# Patient Record
Sex: Female | Born: 1980 | Hispanic: No | Marital: Married | Smoking: Never smoker
Health system: Southern US, Community
[De-identification: ages and names within clinical notes are randomized; demographics above are authoritative.]

---

## 2000-09-08 ENCOUNTER — Emergency Department (HOSPITAL_COMMUNITY): Admission: EM | Admit: 2000-09-08 | Discharge: 2000-09-08 | Payer: Self-pay | Admitting: *Deleted

## 2000-09-09 ENCOUNTER — Encounter: Payer: Self-pay | Admitting: *Deleted

## 2000-09-09 ENCOUNTER — Ambulatory Visit (HOSPITAL_COMMUNITY): Admission: RE | Admit: 2000-09-09 | Discharge: 2000-09-09 | Payer: Self-pay | Admitting: *Deleted

## 2005-09-10 ENCOUNTER — Ambulatory Visit (HOSPITAL_COMMUNITY): Admission: AD | Admit: 2005-09-10 | Discharge: 2005-09-10 | Payer: Self-pay | Admitting: Obstetrics and Gynecology

## 2005-10-12 ENCOUNTER — Inpatient Hospital Stay (HOSPITAL_COMMUNITY): Admission: AD | Admit: 2005-10-12 | Discharge: 2005-10-16 | Payer: Self-pay | Admitting: Obstetrics and Gynecology

## 2005-10-13 ENCOUNTER — Encounter (INDEPENDENT_AMBULATORY_CARE_PROVIDER_SITE_OTHER): Payer: Self-pay | Admitting: Specialist

## 2006-12-05 ENCOUNTER — Emergency Department (HOSPITAL_COMMUNITY): Admission: EM | Admit: 2006-12-05 | Discharge: 2006-12-05 | Payer: Self-pay | Admitting: Emergency Medicine

## 2007-08-04 ENCOUNTER — Ambulatory Visit (HOSPITAL_COMMUNITY): Admission: RE | Admit: 2007-08-04 | Discharge: 2007-08-04 | Payer: Self-pay | Admitting: Family Medicine

## 2007-09-11 ENCOUNTER — Emergency Department (HOSPITAL_COMMUNITY): Admission: EM | Admit: 2007-09-11 | Discharge: 2007-09-11 | Payer: Self-pay | Admitting: Emergency Medicine

## 2009-08-18 ENCOUNTER — Emergency Department (HOSPITAL_COMMUNITY): Admission: EM | Admit: 2009-08-18 | Discharge: 2009-08-18 | Payer: Self-pay | Admitting: Emergency Medicine

## 2010-04-05 LAB — POCT PREGNANCY, URINE: Preg Test, Ur: NEGATIVE

## 2010-06-06 NOTE — H&P (Signed)
NAME:  Sherry Wilson, Sherry Wilson         ACCOUNT NO.:  [id number]   MEDICAL RECORD NO.:  [id number]          PATIENT TYPE:  INP   LOCATION:  LDR1                          FACILITY:  [facility]   PHYSICIAN:  Lazaro Arms, M.D.   DATE OF BIRTH:  1980-10-25   DATE OF ADMISSION:  10/12/2005  DATE OF DISCHARGE:  LH                                HISTORY & PHYSICAL   HISTORY OF PRESENT ILLNESS:  Sherry Wilson is a 30 year old white female gravida 1,  para 0, abortus 0, estimated date of delivery of October 17, 2005 by last  menstrual period and 8 week sonogram, currently at 39-1/[redacted] weeks gestation,  who is admitted for elective induction.  Cervix in the office was 1, 25, and  minus 1 station, vertex, soft.  Her mother has flown in from [place] two  weeks ago.  She has a total of four weeks here and they actually requested  induction last week which I thought was inappropriate but this week, close  to 40 weeks.  They understand and I talked with them extensively, that there  is an increased risk of having to have a Cesarean section due to this  elective induction and they understand that and accept that.  Of course,  they do want to trade that off in order to become delivered before the  patient's Mom has to go back.   PAST MEDICAL HISTORY:  Negative.   PAST SURGICAL HISTORY:  She had a benign skin growth on her head removed.   ALLERGIES:  PENICILLIN.   OBSTETRICAL HISTORY:  She is nulliparous.   MEDICATIONS:  Prenatal vitamins.   LABORATORY DATA:  Blood type A positive.  Rubella immune.  Antibody screen  is negative.  Hepatitis B is negative.  HIV is nonreactive.  HSV was  negative x2.  RPR was nonreactive x2.  Pap smear was normal.  Gonorrhea and  Chlamydia were negative x2.  She declined AFP.  Group B Strep is positive.  Glucola is 117.   IMPRESSION:  1. Intrauterine pregnancy at 39-1/[redacted] weeks gestation.  2. Elective induction of labor due to family reasons.   PLAN:  Patient is admitted for  Foley bulb ripening and Pitocin induction of  labor.  She understands the increased risk of Cesarean section due to  elective induction and is willing to accept that risk for family reasons.     Lazaro Arms, M.D.  Electronically Signed    LHE/MEDQ  D:  10/13/2005  T:  10/13/2005  Job:  [id number]

## 2010-06-06 NOTE — Discharge Summary (Signed)
Sherry Wilson, NOFSINGER            ACCOUNT NO.:  [id number]   MEDICAL RECORD NO.:  [id number]          PATIENT TYPE:  INP   LOCATION:  A413                          FACILITY:  [facility]   PHYSICIAN:  Tilda Burrow, M.D. DATE OF BIRTH:  28-Dec-1980   DATE OF ADMISSION:  10/12/2005  DATE OF DISCHARGE:  09/28/2007LH                                 DISCHARGE SUMMARY   ADMITTING DIAGNOSES:  1. Pregnancy at 39.[redacted] weeks gestation.  2. Elective induction at the patient's request.   DISCHARGE DIAGNOSES:  1. Pregnancy at 39.[redacted] weeks gestation.  2. Elective induction at the patient's request.  3. Failed medical induction.  4. Cephalopelvic disproportion.  5. Transient tachypnea of the newborn necessitating observation times 24      hours.   OPERATION/PROCEDURE:  1. October 12, 2005 _foley Bulb_  cervical ripening.  2. October 13, 2005 Pitocin induction of labor.  3. October 13, 2005 primary low-transverse cervical cesarean section      with delivery of a healthy female infant.  4. October 16, 2005 Gomco circumcision performed on the infant.   DISCHARGE MEDICATIONS:  Tylox 1-2 every four hours as needed for pain.   SUMMARY:  This is a 31 year old female gravida 1, para 0 who requests  elective induction of labor for family reasons as her mother flew in from  [place] two weeks ago; and, induction is scheduled to meet family needs.  In  terms of the infant, it is in the vertex presentation with the cervix is  considered reasonable for induction.   HOSPITAL COURSE:  The patient had full abrupt cervical ripening followed by  Pitocin induction.  Jaena did not have an epidural.  She progressed  through labor reaching complete dilation, and pushed excellently for 1-1.5  hours,  but had second stage arrest of labor.  She ended up going for a  primary cesarean section on the evening of October 13, 2005, with an  estimated blood loss of 600-800 mL.   Postoperative hemoglobin was 9.9 and  hematocrit was 28.2 compared to an  admitting hemoglobin of 11.9 and a hematocrit 34.3.  Maternal blood type is  A positive.  She did excellently with good supportive care of the infant.  The blood gas on the infant showed a pH of 7.280, pCO2 53 with TCO2 of 22.  The patient had an uneventful surgical course.   The baby did well for 24 hours and then required some extra oxygen for 24  hours due to tachypnea felt to be related to transient tachypnea in a  newborn only.  The mother was kept for a third day as a precaution; and, if  the baby is not able to be discharged this P.M. he will go to [facility]  status.   FOLLOW UP:  The patient will follow up in four days for staple removal.      Tilda Burrow, M.D.  Electronically Signed     JVF/MEDQ  D:  10/16/2005  T:  10/18/2005  Job:  [id number]   cc:   Jeoffrey Massed, MD  Fax: [phone]

## 2010-06-06 NOTE — Op Note (Signed)
Sherry Wilson, Sherry Wilson         ACCOUNT NO.:  [id number]   MEDICAL RECORD NO.:  [id number]          PATIENT TYPE:  INP   LOCATION:  A413                          FACILITY:  [facility]   PHYSICIAN:  Tilda Burrow, M.D. DATE OF BIRTH:  01/22/1980   DATE OF PROCEDURE:  DATE OF DISCHARGE:                                 OPERATIVE REPORT   PREOPERATIVE DIAGNOSES:  1. Pregnancy, 39 weeks, elective induction.  2. Cephalopelvic disproportion.   POSTOPERATIVE DIAGNOSES:  1. Pregnancy, 39 weeks, elective induction.  2. Cephalopelvic disproportion.   PROCEDURE:  Primary low lower segment transverse cesarean section.   SURGEON:  Tilda Burrow, M.D.   ASSISTANTAmie Critchley, CST.   ANESTHESIA:  Spinal, Glynn Octave.   COMPLICATIONS:  None.   FINDINGS:  A 9 pound 7 ounce female infant. Significant molding of the vertex  in occiput transverse position. The vertex was not even difficult to get out  of the pelvic inlet despite 1-1/2 hours of pushing.   DETAILS OF PROCEDURE:  The patient was taken to the operating room and  spinal anesthesia introduced, confirmed as effective with Foley catheter in  place.  The Pfannenstiel type incision was performed in standard fashion.  The vertex had not really entered the pelvis very well. Lower uterine  segment transverse incision was made without difficulty, extended laterally  using index finger traction and the fetal vertex rotated from its very  molded position into the vertex and the infant delivered easily.  Apgars 9  and 9 were assigned. Dr. Milinda Cave took care of the baby. Please see his notes  for further details.  The cord blood samples were obtained and the placenta  delivered intact, Schultze presentation. The uterus was irrigated with  saline solution and closed in two-layer running locking closure the first  layer, continuous running, the second layer.  The bladder flap was  approximated with interrupted 2-0 chromic x2.  The abdomen was  irrigated and  peritoneum closed. Sponge and needle counts correct.  Muscles were  loosely reapproximated with 2-0 chromic and then the procedure completed by  closure of the fascia with continuous running 0 Vicryl, interrupted 2-0  plain closure of the subcu fatty space and staple closure of the skin.  The  patient tolerated the procedure well and went to recovery room in good  condition.  Sponge and needle counts correct.      Tilda Burrow, M.D.  Electronically Signed     JVF/MEDQ  D:  10/13/2005  T:  10/15/2005  Job:  [id number]   cc:   Jeoffrey Massed, MD  Fax: [phone]

## 2010-06-06 NOTE — Group Therapy Note (Signed)
Sherry Wilson, Sherry Wilson            ACCOUNT NO.:  [id number]   MEDICAL RECORD NO.:  [id number]          PATIENT TYPE:  OIB   LOCATION:  A415                          FACILITY:  [facility]   PHYSICIAN:  Tilda Burrow, M.D. DATE OF BIRTH:  25-Dec-1980   DATE OF PROCEDURE:  DATE OF DISCHARGE:  09/10/2005                                   PROGRESS NOTE   HISTORY OF PRESENT ILLNESS:  Sherry Wilson is about 34-1/2 weeks' gestation, came  in with complaints of extremity swelling and vaginal pain.  She states that  she is just very sore and feels like her bones are breaking apart are when  she sits down or stands up or moves.  She is not having contractions.  Her  cervix was closed.  Fetal heart rate was reactive.  She does have mild  edema.  Her blood pressure was in the 100s over 70s.  UA is negative.   IMPRESSION:  Pains of pregnancy, reassuring maternal/fetal status.  Sherry Wilson  was discharged home with palliative measures for pains of pregnancy  discussed.      Jacklyn Shell, C.N.M.      Tilda Burrow, M.D.  Electronically Signed    FC/MEDQ  D:  09/10/2005  T:  09/11/2005  Job:  [id number]

## 2010-10-28 LAB — URINALYSIS, ROUTINE W REFLEX MICROSCOPIC
Leukocytes, UA: NEGATIVE
Nitrite: NEGATIVE
Specific Gravity, Urine: >1.03 — ABNORMAL HIGH
pH: 5

## 2010-10-28 LAB — URINE MICROSCOPIC-ADD ON

## 2010-10-28 LAB — PREGNANCY, URINE: Preg Test, Ur: NEGATIVE

## 2011-08-09 ENCOUNTER — Encounter (HOSPITAL_COMMUNITY): Payer: Self-pay | Admitting: Emergency Medicine

## 2011-08-09 ENCOUNTER — Emergency Department (HOSPITAL_COMMUNITY): Payer: BC Managed Care – PPO

## 2011-08-09 ENCOUNTER — Emergency Department (HOSPITAL_COMMUNITY)
Admission: EM | Admit: 2011-08-09 | Discharge: 2011-08-10 | Disposition: A | Discharge status: Home / Self Care | Payer: BC Managed Care – PPO | Attending: Emergency Medicine | Admitting: Emergency Medicine

## 2011-08-09 DIAGNOSIS — R110 Nausea: Secondary | ICD-10-CM | POA: Insufficient documentation

## 2011-08-09 DIAGNOSIS — M79609 Pain in unspecified limb: Secondary | ICD-10-CM | POA: Insufficient documentation

## 2011-08-09 DIAGNOSIS — S8011XA Contusion of right lower leg, initial encounter: Secondary | ICD-10-CM

## 2011-08-09 DIAGNOSIS — R42 Dizziness and giddiness: Secondary | ICD-10-CM | POA: Insufficient documentation

## 2011-08-09 LAB — URINALYSIS, ROUTINE W REFLEX MICROSCOPIC
Ketones, ur: NEGATIVE mg/dL
Protein, ur: NEGATIVE mg/dL
Urobilinogen, UA: 0.2 mg/dL (ref 0.0–1.0)

## 2011-08-09 LAB — CBC WITH DIFFERENTIAL/PLATELET
Basophils Absolute: 0 10*3/uL (ref 0.0–0.1)
Eosinophils Absolute: 0.1 10*3/uL (ref 0.0–0.7)
Eosinophils Relative: 2 % (ref 0–5)
MCH: 30.7 pg (ref 26.0–34.0)
MCV: 88 fL (ref 78.0–100.0)
Platelets: 225 10*3/uL (ref 150–400)
RDW: 12.5 % (ref 11.5–15.5)

## 2011-08-09 LAB — URINE MICROSCOPIC-ADD ON

## 2011-08-09 LAB — BASIC METABOLIC PANEL
Calcium: 8.9 mg/dL (ref 8.4–10.5)
Creatinine, Ser: 0.7 mg/dL (ref 0.50–1.10)
GFR calc non Af Amer: >90 mL/min (ref >90)
Glucose, Bld: 100 mg/dL — ABNORMAL HIGH (ref 70–99)
Sodium: 139 mEq/L (ref 135–145)

## 2011-08-09 MED ORDER — ONDANSETRON HCL 4 MG PO TABS: 8.0000 mg | ORAL_TABLET | Freq: Three times a day (TID) | ORAL | Status: AC | PRN

## 2011-08-09 MED ORDER — SODIUM CHLORIDE 0.9 % IV BOLUS (SEPSIS)
1000.0000 mL | Freq: Once | INTRAVENOUS | Status: AC
  Administered 2011-08-09: 1000 mL via INTRAVENOUS

## 2011-08-09 MED ORDER — ONDANSETRON 8 MG PO TBDP: 8.0000 mg | ORAL_TABLET | Freq: Once | ORAL | Status: DC

## 2011-08-09 MED ORDER — KETOROLAC TROMETHAMINE 30 MG/ML IJ SOLN
30.0000 mg | Freq: Once | INTRAMUSCULAR | Status: AC
  Administered 2011-08-09: 30 mg via INTRAVENOUS
  Filled 2011-08-09: qty 1

## 2011-08-09 MED ORDER — IBUPROFEN 600 MG PO TABS: 600.0000 mg | ORAL_TABLET | Freq: Four times a day (QID) | ORAL | Status: AC | PRN

## 2011-08-09 MED ORDER — ONDANSETRON HCL 4 MG/2ML IJ SOLN
4.0000 mg | Freq: Once | INTRAMUSCULAR | Status: AC
  Administered 2011-08-09: 4 mg via INTRAVENOUS

## 2011-08-09 MED ORDER — ONDANSETRON HCL 4 MG/2ML IJ SOLN
INTRAMUSCULAR | Status: AC
  Filled 2011-08-09: qty 2

## 2011-08-09 NOTE — ED Provider Notes (Signed)
History     CSN: [id number]  Arrival date & time 08/09/11  1308   First MD Initiated Contact with Patient 08/09/11 [phone]      Chief Complaint  Patient presents with  . Leg Pain  . Dizziness  . Nausea    (Consider location/radiation/quality/duration/timing/severity/associated sxs/prior treatment) HPI Comments: Sherry Wilson presents with persistent severe pain in her right lower anterior leg after hitting it on the steel bar of a go cart one week age.  The area was very swollen and bruised initially and she used ice throughout the week which has improved the swelling but not the pain. Her pain is constant and sharp without radiation.  It is worse with weight bearing but is able to bear weight.   She has also used tylenol without relief of symptoms.  She also reports nausea and feelings of lightheadedness for the past week which she partially believes is related to her lower extremity pain,  But also states she has not been drinking enough and believes she may be dehydrated.  Her LMP was 7 days ago and light - she and her husband are trying to get pregnant currently.  She reports transient increased lightheadedness when she stands too quickly,  Which resolves when she sits.  She denies palpitations,  Shortness of breath and chest pain.   The history is provided by the patient.    History reviewed. No pertinent past medical history.  Past Surgical History  Procedure Date  . Cesarean section     No family history on file.  History  Substance Use Topics  . Smoking status: Never Smoker   . Smokeless tobacco: Not on file  . Alcohol Use: No    OB History    Grav Para Term Preterm Abortions TAB SAB Ect Mult Living                  Review of Systems  Constitutional: Negative for fever.  HENT: Negative for congestion, sore throat and neck pain.   Eyes: Negative.   Respiratory: Negative for chest tightness and shortness of breath.   Cardiovascular: Negative for chest pain and  palpitations.  Gastrointestinal: Positive for nausea. Negative for vomiting, abdominal pain, diarrhea and constipation.  Genitourinary: Negative.   Musculoskeletal: Positive for arthralgias. Negative for joint swelling.  Skin: Negative.  Negative for rash and wound.  Neurological: Positive for light-headedness. Negative for weakness, numbness and headaches.  Hematological: Negative.   Psychiatric/Behavioral: Negative.     Allergies  Penicillins and Shellfish allergy  Home Medications  No current outpatient prescriptions on file.  BP 98/65  Pulse 90  Temp 98.7 F (37.1 C) (Oral)  Resp 16  Ht 5\' 3"  (1.6 m)  Wt 215 lb (97.523 kg)  BMI 38.09 kg/m2  SpO2 95%  LMP 08/03/2011  Physical Exam  Eyes: Conjunctivae are normal.  Cardiovascular:  Pulses:      Dorsalis pedis pulses are 2+ on the right side, and 2+ on the left side.       Posterior tibial pulses are 2+ on the right side, and 2+ on the left side.  Skin: Skin is warm. No rash noted. No erythema.       Old,  Near resolved ecchymosis right lower anterior tibia,  No palpable residual hematoma.  TTP along tibia,  No calf ttp or edema.     ED Course  Procedures (including critical care time)   Labs Reviewed  CBC WITH DIFFERENTIAL  BASIC METABOLIC PANEL  URINALYSIS,  ROUTINE W REFLEX MICROSCOPIC   No results found. Results for orders placed during the hospital encounter of 08/09/11  CBC WITH DIFFERENTIAL      Component Value Range   WBC 6.9  4.0 - 10.5 K/uL   RBC 4.59  3.87 - 5.11 MIL/uL   Hemoglobin 14.1  12.0 - 15.0 g/dL   HCT 16.1  09.6 - 04.5 %   MCV 88.0  78.0 - 100.0 fL   MCH 30.7  26.0 - 34.0 pg   MCHC 34.9  30.0 - 36.0 g/dL   RDW 40.9  81.1 - 91.4 %   Platelets 225  150 - 400 K/uL   Neutrophils Relative 72  43 - 77 %   Neutro Abs 5.0  1.7 - 7.7 K/uL   Lymphocytes Relative 21  12 - 46 %   Lymphs Abs 1.5  0.7 - 4.0 K/uL   Monocytes Relative 5  3 - 12 %   Monocytes Absolute 0.3  0.1 - 1.0 K/uL    Eosinophils Relative 2  0 - 5 %   Eosinophils Absolute 0.1  0.0 - 0.7 K/uL   Basophils Relative 0  0 - 1 %   Basophils Absolute 0.0  0.0 - 0.1 K/uL  BASIC METABOLIC PANEL      Component Value Range   Sodium 139  135 - 145 mEq/L   Potassium 3.8  3.5 - 5.1 mEq/L   Chloride 103  96 - 112 mEq/L   CO2 27  19 - 32 mEq/L   Glucose, Bld 100 (*) 70 - 99 mg/dL   BUN 10  6 - 23 mg/dL   Creatinine, Ser 7.82  0.50 - 1.10 mg/dL   Calcium 8.9  8.4 - 95.6 mg/dL   GFR calc non Af Amer >90  >90 mL/min   GFR calc Af Amer >90  >90 mL/min  URINALYSIS, ROUTINE W REFLEX MICROSCOPIC      Component Value Range   Color, Urine YELLOW  YELLOW   APPearance CLEAR  CLEAR   Specific Gravity, Urine 1.020  1.005 - 1.030   pH 6.5  5.0 - 8.0   Glucose, UA NEGATIVE  NEGATIVE mg/dL   Hgb urine dipstick SMALL (*) NEGATIVE   Bilirubin Urine NEGATIVE  NEGATIVE   Ketones, ur NEGATIVE  NEGATIVE mg/dL   Protein, ur NEGATIVE  NEGATIVE mg/dL   Urobilinogen, UA 0.2  0.0 - 1.0 mg/dL   Nitrite NEGATIVE  NEGATIVE   Leukocytes, UA NEGATIVE  NEGATIVE  URINE MICROSCOPIC-ADD ON      Component Value Range   Squamous Epithelial / LPF FEW (*) RARE   WBC, UA 0-2  <3 WBC/hpf   Bacteria, UA FEW (*) RARE   Urine bedside preg - negative.  No diagnosis found.  12:46 PM Pt with orthostatic VS.  Given 1 L NS.  Still with mild lightheadedness when stands.  Repeat of NS 1 L bolus x 1. Pt is also taking po fluids.  Pt did have a near syncopal event followed by emesis when IV was being started.  States she often becomes lightheaded around needles.  MDM  Labs and xrays reviewed prior to dc home.  Pt felt better at time of dc.  Prescribed ibuprofen for pain relief,  zofran prn nausea.  Pt with no cp, no  abd pain,  u preg negative - no emergent reason for nausea found on todays exam.  Pt encouraged heat applied to tibia area.  Recheck by pcp if sx not improving  over the next several days or for any worsened sx.        Burgess Amor,  [place] 08/10/11 [phone]

## 2011-08-09 NOTE — ED Notes (Signed)
Pt became unresponsive and pale just after IV was initiated. EMD, PA to bedside. Pt was only unresponsive for ~ 10 sec. Pt then began vomiting. Pt was very anxious about starting IV and stated she sometimes gets queasy. Pt was tearful earlier when having blood drawn. NAD at this time. Pt is responding appropriately and skin color is pink, WNL.

## 2011-08-09 NOTE — ED Notes (Signed)
Pt c/o rle pain after hitting leg on a steel bar x 1 week ago. Pt also c/o intermittent nausea and dizziness x 1 week.

## 2011-08-09 NOTE — ED Notes (Signed)
NAD. Pt resting quietly, awake and alert. Ice chips offered to pt.

## 2011-08-10 NOTE — ED Provider Notes (Signed)
Medical screening examination/treatment/procedure(s) were performed by non-physician practitioner and as supervising physician I was immediately available for consultation/collaboration.   Laray Anger, DO 08/10/11 1722

## 2013-07-17 IMAGING — CR DG TIBIA/FIBULA 2V*R*
2 series · 2 of 2 positions shown · non-contrast
Comparison: None.

CLINICAL DATA: Pain to mid/distal tibia

RIGHT TIBIA AND FIBULA - 2 VIEW

[view not recorded (1 of 2)]
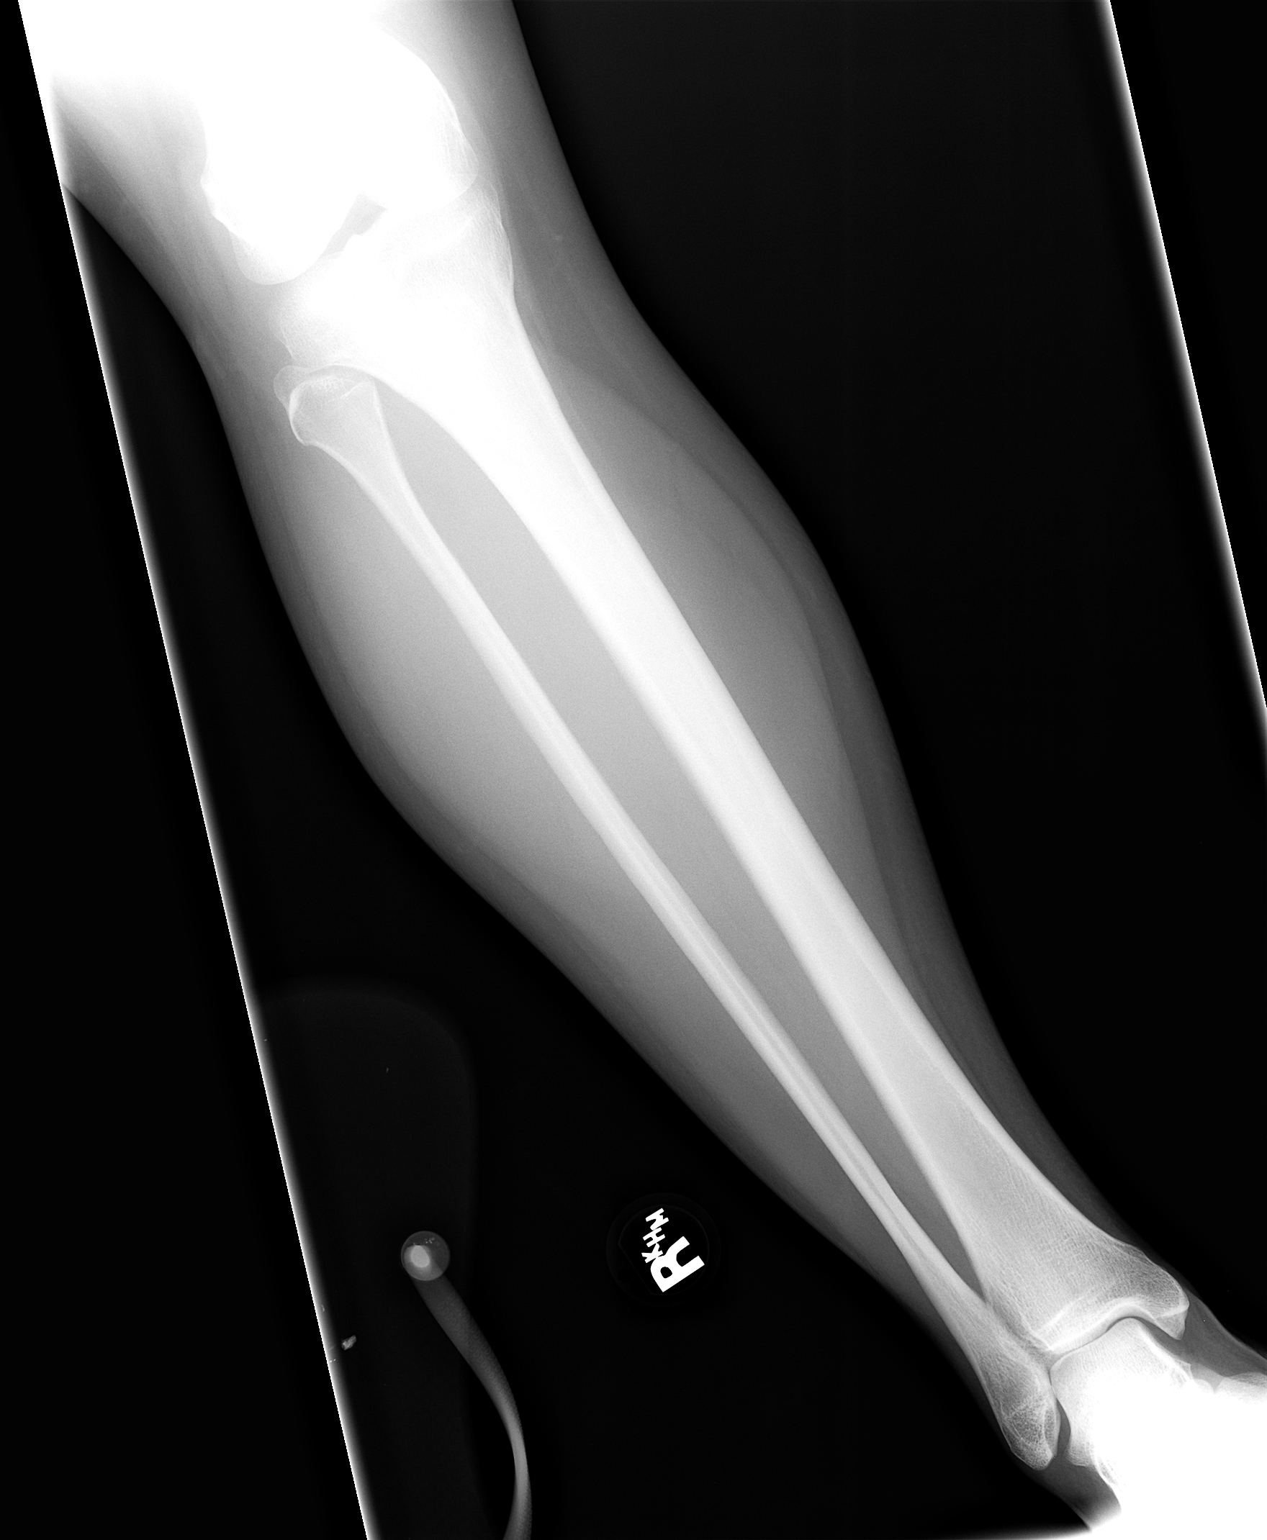

[view not recorded (2 of 2)]
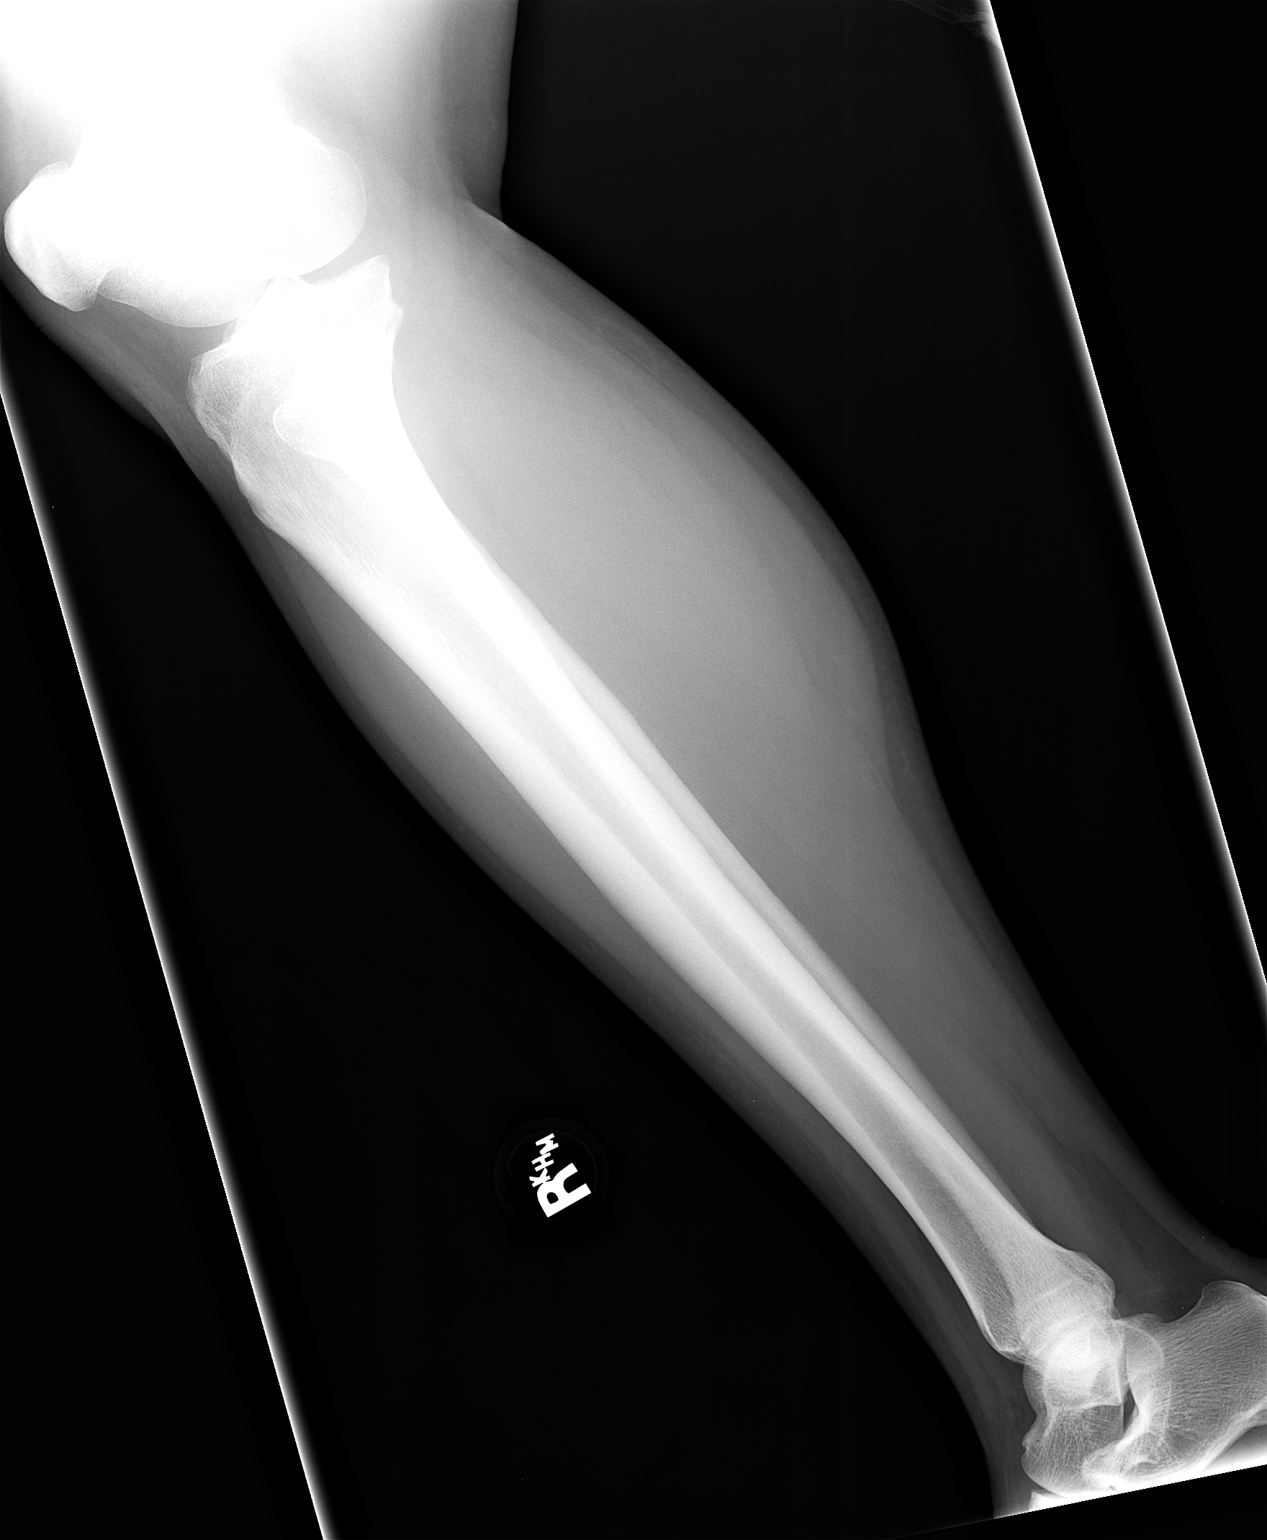

[2 of 2 positions shown; findings below may reference images not displayed]

FINDINGS: No fracture or dislocation is seen.

Visualized joint spaces are preserved.

The visualized soft tissues are unremarkable.
IMPRESSION: No fracture or dislocation is seen.

## 2014-06-29 ENCOUNTER — Encounter: Payer: Self-pay | Admitting: Obstetrics and Gynecology

## 2014-07-02 ENCOUNTER — Encounter: Payer: Self-pay | Admitting: *Deleted

## 2016-09-01 ENCOUNTER — Emergency Department (HOSPITAL_COMMUNITY)
Admission: EM | Admit: 2016-09-01 | Discharge: 2016-09-01 | Disposition: A | Discharge status: Home / Self Care | Payer: Worker's Compensation | Attending: Emergency Medicine | Admitting: Emergency Medicine

## 2016-09-01 ENCOUNTER — Emergency Department (HOSPITAL_COMMUNITY): Payer: Worker's Compensation

## 2016-09-01 ENCOUNTER — Encounter (HOSPITAL_COMMUNITY): Payer: Self-pay | Admitting: Emergency Medicine

## 2016-09-01 DIAGNOSIS — W260XXA Contact with knife, initial encounter: Secondary | ICD-10-CM | POA: Diagnosis not present

## 2016-09-01 DIAGNOSIS — S41112A Laceration without foreign body of left upper arm, initial encounter: Secondary | ICD-10-CM | POA: Diagnosis not present

## 2016-09-01 DIAGNOSIS — Y939 Activity, unspecified: Secondary | ICD-10-CM | POA: Diagnosis not present

## 2016-09-01 DIAGNOSIS — Y929 Unspecified place or not applicable: Secondary | ICD-10-CM | POA: Diagnosis not present

## 2016-09-01 DIAGNOSIS — Y999 Unspecified external cause status: Secondary | ICD-10-CM | POA: Insufficient documentation

## 2016-09-01 DIAGNOSIS — S61412A Laceration without foreign body of left hand, initial encounter: Secondary | ICD-10-CM

## 2016-09-01 DIAGNOSIS — S59912A Unspecified injury of left forearm, initial encounter: Secondary | ICD-10-CM | POA: Diagnosis present

## 2016-09-01 LAB — I-STAT CHEM 8, ED
BUN: 17 mg/dL (ref 6–20)
CALCIUM ION: 1.14 mmol/L — AB (ref 1.15–1.40)
CREATININE: 0.9 mg/dL (ref 0.44–1.00)
Chloride: 107 mmol/L (ref 101–111)
GLUCOSE: 106 mg/dL — AB (ref 65–99)
HEMATOCRIT: 42 % (ref 36.0–46.0)
HEMOGLOBIN: 14.3 g/dL (ref 12.0–15.0)
Potassium: 3.8 mmol/L (ref 3.5–5.1)
Sodium: 144 mmol/L (ref 135–145)
TCO2: 26 mmol/L (ref 0–100)

## 2016-09-01 MED ORDER — LIDOCAINE-EPINEPHRINE (PF) 1 %-1:200000 IJ SOLN
10.0000 mL | Freq: Once | INTRAMUSCULAR | Status: DC
  Filled 2016-09-01: qty 30

## 2016-09-01 NOTE — ED Provider Notes (Signed)
Emergency Department Provider Note   I have reviewed the triage vital signs and the nursing notes.   HISTORY  Chief Complaint Laceration   HPI Sherry Wilson is a 36 y.o. female presents to the emergency department for evaluation of left forearm laceration. She was opening tennis balls with a box cutter when she became distracted and inadvertently cut into her left forearm. She describes immediate pain and large volume bleeding. 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 She denies any syncope. No chest pain or shortness of breath. Her last tetanus shot was in August 2016. She is right-handed. She denies any tingling or numbness in the fingers or hand on the left. No additional areas of bleeding or injury.   History reviewed. No pertinent past medical history.  There are no active problems to display for this patient.   Past Surgical History:  Procedure Laterality Date  . CESAREAN SECTION        Allergies Shellfish allergy and Penicillins  No family history on file.  Social History Social History  Substance Use Topics  . Smoking status: Never Smoker  . Smokeless tobacco: Never Used  . Alcohol use No    Review of Systems  Constitutional: No fever/chills Eyes: No visual changes. ENT: No sore throat. Cardiovascular: Denies chest pain. Respiratory: Denies shortness of breath. Gastrointestinal: No abdominal pain.  No nausea, no vomiting.  No diarrhea.  No constipation. Genitourinary: Negative for dysuria. Musculoskeletal: Negative for back pain. Skin: Negative for rash. Left forearm laceration and left palm laceration.  Neurological: Negative for headaches, focal weakness or numbness.  10-point ROS otherwise negative.  ____________________________________________   PHYSICAL EXAM:  VITAL SIGNS: Vitals:   09/01/16 1705 09/01/16 1907  BP: 99/75 119/61  Pulse: 93  80  Resp: 19 18  Temp: 98.8 F (37.1 C) 98.7 F (37.1 C)  SpO2: 100% 97%    Constitutional: Alert and oriented. Well appearing and in no acute distress. Eyes: Conjunctivae are normal.  Head: Atraumatic. Nose: No congestion/rhinnorhea. Mouth/Throat: Mucous membranes are moist.  Neck: No stridor.  Cardiovascular: Normal rate, regular rhythm. Good peripheral circulation. Grossly normal heart sounds.   Respiratory: Normal respiratory effort.  No retractions. Lungs CTAB. Gastrointestinal: Soft and nontender. No distention.  Musculoskeletal: No lower extremity tenderness nor edema. No gross deformities of extremities. Neurologic:  Normal speech and language. No gross focal neurologic deficits are appreciated. Normal wrist ROM and strength. Normal finger strength. Normal peripheral nerve exam of left hand.  Skin:  Skin is warm and dry. 8 cm left forearm laceration through subcutaneous tissues to exposed tendon and oozing blood. No arterial blood. No tendon laceration after exam through full ROM. .   ____________________________________________   LABS (all labs ordered are listed, but only abnormal results are displayed)  Labs Reviewed  I-STAT CHEM 8, ED - Abnormal; Notable for the following:       Result Value   Glucose, Bld 106 (*)    Calcium, Ion 1.14 (*)    All other components within normal limits   ____________________________________________  RADIOLOGY  Dg Forearm Left  Result Date: 09/01/2016 CLINICAL DATA:  Laceration, evaluate for foreign body EXAM: LEFT FOREARM - 2 VIEW COMPARISON:  None. FINDINGS: No fracture or malalignment. No radiopaque foreign body. Soft tissue injury along the volar aspect of the distal forearm. IMPRESSION: No acute osseous abnormality Electronically Signed   By: Adrian Prows.D.  On: 09/01/2016 17:56    ____________________________________________   PROCEDURES  Procedure(s) performed:   Marland KitchenMarland KitchenLaceration Repair Date/Time: 09/01/2016 11:48  PM Performed by: Roston Grunewald, Arlyss Repress Authorized by: Maia Plan   Consent:    Consent obtained:  Verbal   Consent given by:  Patient   Risks discussed:  Infection, poor cosmetic result, pain, retained foreign body, tendon damage, vascular damage, poor wound healing, need for additional repair and nerve damage   Alternatives discussed:  No treatment Anesthesia (see MAR for exact dosages):    Anesthesia method:  Local infiltration   Local anesthetic:  Lidocaine 1% WITH epi Laceration details:    Location:  Shoulder/arm   Shoulder/arm location:  L lower arm   Length (cm):  9   Depth (mm):  10 Repair type:    Repair type:  Complex Pre-procedure details:    Preparation:  Patient was prepped and draped in usual sterile fashion and imaging obtained to evaluate for foreign bodies Exploration:    Limited defect created (wound extended): no     Hemostasis achieved with:  Direct pressure   Wound exploration: wound explored through full range of motion and entire depth of wound probed and visualized     Wound extent: areolar tissue violated, fascia violated and vascular damage     Wound extent: no foreign bodies/material noted, no muscle damage noted, no nerve damage noted, no tendon damage noted and no underlying fracture noted     Contaminated: no   Treatment:    Area cleansed with:  Saline and Betadine   Amount of cleaning:  Extensive   Irrigation solution:  Sterile saline   Irrigation volume:  1000 ml   Irrigation method:  Pressure wash   Visualized foreign bodies/material removed: no     Debridement:  None   Undermining:  Minimal   Scar revision: no   Subcutaneous repair:    Suture size:  3-0   Suture material:  Vicryl   Number of sutures:  3 Skin repair:    Repair method:  Sutures   Suture size:  4-0   Suture material:  Prolene   Suture technique:  Simple interrupted   Number of sutures:  8 Approximation:    Approximation:  Close   Vermilion border: well-aligned    Post-procedure details:    Dressing:  Open (no dressing)   Patient tolerance of procedure:  Tolerated well, no immediate complications .Marland KitchenLaceration Repair Date/Time: 09/01/2016 11:51 PM Performed by: Arrion Broaddus, Arlyss Repress Authorized by: Maia Plan   Consent:    Consent obtained:  Verbal   Consent given by:  Patient   Risks discussed:  Infection, pain, retained foreign body, vascular damage, poor wound healing, poor cosmetic result, need for additional repair, nerve damage and tendon damage   Alternatives discussed:  No treatment Anesthesia (see MAR for exact dosages):    Anesthesia method:  Local infiltration   Local anesthetic:  Lidocaine 1% WITH epi Laceration details:    Location:  Hand   Hand location:  L palm   Length (cm):  1   Depth (mm):  3 Repair type:    Repair type:  Simple Pre-procedure details:    Preparation:  Patient was prepped and draped in usual sterile fashion Exploration:    Hemostasis achieved with:  Direct pressure   Wound exploration: entire depth of wound probed and visualized     Wound extent: no foreign bodies/material noted, no muscle damage noted, no nerve damage noted, no tendon damage noted, no  underlying fracture noted and no vascular damage noted     Contaminated: no   Treatment:    Area cleansed with:  Saline   Amount of cleaning:  Standard   Irrigation solution:  Sterile saline   Visualized foreign bodies/material removed: no   Skin repair:    Repair method:  Sutures   Suture size:  4-0   Suture material:  Prolene   Suture technique:  Simple interrupted   Number of sutures:  2 Approximation:    Approximation:  Close   Vermilion border: well-aligned   Post-procedure details:    Dressing:  Open (no dressing)   Patient tolerance of procedure:  Tolerated well, no immediate complications     ____________________________________________   INITIAL IMPRESSION / ASSESSMENT AND PLAN / ED COURSE  Pertinent labs & imaging results that were  available during my care of the patient were reviewed by me and considered in my medical decision making (see chart for details).  Patient has a large laceration on the left forearm and smaller laceration to the proximal aspect of the left palm. The forearm laceration is through the subcutaneous tissue. There is a visible tendon but no obvious tendon injury. She has strong radial and ulnar pulses. Good capillary refill. No pulsatile bleeding from the wound. She has normal strength (flexion/extension) of the left wrist. Normal strength and sensation in the left fingers. Normal testing of radial, ulnar, and medial nerves of the left hand. Plan for plain film to evaluate for FB and suture.   Laceration repaired as above. Discussed wound care and follow up plan. Suture removal in 1 week. No abx at this time. Tetanus UTD.   At this time, I do not feel there is any life-threatening condition present. I have reviewed and discussed all results (EKG, imaging, lab, urine as appropriate), exam findings with patient. I have reviewed nursing notes and appropriate previous records.  I feel the patient is safe to be discharged home without further emergent workup. Discussed usual and customary return precautions. Patient and family (if present) verbalize understanding and are comfortable with this plan.  Patient will follow-up with their primary care provider. If they do not have a primary care provider, information for follow-up has been provided to them. All questions have been answered.  ____________________________________________  FINAL CLINICAL IMPRESSION(S) / ED DIAGNOSES  Final diagnoses:  Laceration of left upper extremity, initial encounter  Laceration of left palm, initial encounter     MEDICATIONS GIVEN DURING THIS VISIT:  None  NEW OUTPATIENT MEDICATIONS STARTED DURING THIS VISIT:  None   Note:  This document was prepared using Dragon voice recognition software and may include unintentional  dictation errors.  Alona BeneJoshua Almas Rake, MD Emergency Medicine    Karey Stucki, Arlyss RepressJoshua G, MD 09/01/16 [phone]

## 2016-09-01 NOTE — Discharge Instructions (Signed)
You were seen in the ED today after a laceration to the left forearm and palm. These were sutured closed. Follow up with your PCP or return to the ED in 7 days for suture removal.   Return to the ED sooner with any sudden worsening pain, numbness, or redness/drainage from the wound.

## 2016-09-01 NOTE — ED Triage Notes (Signed)
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  Arm is wrapped in pressure dressing and dressing is clean and dry.  +sensation to fingers.

## 2016-09-01 NOTE — ED Notes (Signed)
Pt's left arm and hand cleaned, covered with telfa, sterile gauze, and wrapped with kerlix.

## 2017-08-19 ENCOUNTER — Other Ambulatory Visit: Payer: Self-pay

## 2017-08-19 DIAGNOSIS — I83892 Varicose veins of left lower extremities with other complications: Secondary | ICD-10-CM

## 2017-08-20 ENCOUNTER — Other Ambulatory Visit (HOSPITAL_COMMUNITY): Payer: Self-pay | Admitting: Physician Assistant

## 2017-08-20 ENCOUNTER — Ambulatory Visit (HOSPITAL_COMMUNITY)
Admission: RE | Admit: 2017-08-20 | Discharge: 2017-08-20 | Disposition: A | Discharge status: Home / Self Care | Payer: BC Managed Care – PPO | Source: Ambulatory Visit | Attending: Physician Assistant | Admitting: Physician Assistant

## 2017-08-20 DIAGNOSIS — M5442 Lumbago with sciatica, left side: Secondary | ICD-10-CM

## 2017-08-20 DIAGNOSIS — M545 Low back pain: Secondary | ICD-10-CM | POA: Insufficient documentation

## 2017-11-16 ENCOUNTER — Ambulatory Visit (HOSPITAL_COMMUNITY)
Admission: RE | Admit: 2017-11-16 | Discharge: 2017-11-16 | Disposition: A | Discharge status: Home / Self Care | Payer: BC Managed Care – PPO | Source: Ambulatory Visit | Attending: Vascular Surgery | Admitting: Vascular Surgery

## 2017-11-16 DIAGNOSIS — I83892 Varicose veins of left lower extremities with other complications: Secondary | ICD-10-CM | POA: Diagnosis present

## 2017-11-22 ENCOUNTER — Ambulatory Visit: Payer: BC Managed Care – PPO | Admitting: Vascular Surgery

## 2017-11-22 ENCOUNTER — Encounter (INDEPENDENT_AMBULATORY_CARE_PROVIDER_SITE_OTHER): Payer: BC Managed Care – PPO | Admitting: Surgery

## 2017-11-22 ENCOUNTER — Encounter: Payer: Self-pay | Admitting: Vascular Surgery

## 2017-11-22 VITALS — BP 121/72 | HR 75 | Temp 100.0°F | Resp 18 | Ht 62.0 in | Wt 214.2 lb

## 2017-11-22 DIAGNOSIS — I868 Varicose veins of other specified sites: Secondary | ICD-10-CM

## 2017-11-22 DIAGNOSIS — I83892 Varicose veins of left lower extremities with other complications: Secondary | ICD-10-CM | POA: Diagnosis not present

## 2017-11-22 NOTE — Progress Notes (Signed)
[facility]ist of [facility]  Patient name: Sherry Wilson MRN: [id number] DOB: Jun 24, 1980 Sex: female  REASON FOR CONSULT: Evaluation of painful varicosities left leg  HPI: Sherry Wilson is a 37 y.o. female, who is here today for evaluation of painful varicosities in her left leg.  He reports pain specifically over the varicosities in her distal thigh and calf.  Also reports a generalized swelling and aching distal to this.  She has worn support hose but not thigh-high compression.  Has no history of DVT.  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  No family history on file.  SOCIAL HISTORY: Social History   Socioeconomic History  . Marital status: Married    Spouse name: Not on file  . Number of children: Not on file  . Years of education: Not on file  . Highest education level: Not on file  Occupational History  . Not on file  Social Needs  . Financial resource strain: Not on file  . Food insecurity:    Worry: Not on file    Inability: Not on file  . Transportation needs:    Medical: Not on file    Non-medical: Not on file  Tobacco Use  . Smoking status: Never Smoker  . Smokeless tobacco: Never Used  Substance and Sexual Activity  . Alcohol use: No  . Drug use: No  . Sexual activity: Not on file  Lifestyle  . Physical activity:    Days per week: Not on file    Minutes per session: Not on file  . Stress: Not on file  Relationships  . Social connections:    Talks on phone: Not on file    Gets together: Not on file    Attends religious service: Not on file    Active member of club or organization: Not on file    Attends meetings of clubs or organizations: Not on file    Relationship status: Not on file  . Intimate partner violence:    Fear of current or ex partner: Not on file    Emotionally abused: Not on file   Physically abused: Not on file    Forced sexual activity: Not on file  Other Topics Concern  . Not on file  Social History Narrative  . Not on file    Allergies  Allergen Reactions  . Shellfish Allergy Anaphylaxis  . Penicillins Nausea And Vomiting    .Has patient had a PCN reaction causing immediate rash, facial/tongue/throat swelling, SOB or lightheadedness with hypotension: No Has patient had a PCN reaction causing severe rash involving mucus membranes or skin necrosis: No Has patient had a PCN reaction that required hospitalization: No Has patient had a PCN reaction occurring within the last 10 years: No If all of the above answers are "NO", then may proceed with Cephalosporin use.     Current Outpatient Medications  Medication Sig Dispense Refill  . cyclobenzaprine (FLEXERIL) 5 MG tablet Take 5 mg by mouth 3 (three) times daily as needed for muscle spasms.     No current facility-administered medications for this visit.     REVIEW OF SYSTEMS:  [X]  denotes positive finding, [ ]  denotes negative finding Cardiac  Comments:  Chest pain or chest pressure:    Shortness of breath upon exertion:    Short of breath when lying flat:    Irregular heart rhythm:  Vascular    Pain in calf, thigh, or hip brought on by ambulation: x   Pain in feet at night that wakes you up from your sleep:     Blood clot in your veins:    Leg swelling:         Pulmonary    Oxygen at home:    Productive cough:     Wheezing:         Neurologic    Sudden weakness in arms or legs:     Sudden numbness in arms or legs:     Sudden onset of difficulty speaking or slurred speech:    Temporary loss of vision in one eye:     Problems with dizziness:         Gastrointestinal    Blood in stool:     Vomited blood:         Genitourinary    Burning when urinating:     Blood in urine:        Psychiatric    Major depression:         Hematologic    Bleeding problems:    Problems with blood  clotting too easily:        Skin    Rashes or ulcers:        Constitutional    Fever or chills:      PHYSICAL EXAM: Vitals:   11/22/17 1457  BP: 121/72  Pulse: 75  Resp: 18  Temp: 100 F (37.8 C)  TempSrc: Temporal  Weight: 214 lb 3.2 oz (97.2 kg)  Height: 5\' 2"  (1.575 m)    GENERAL: The patient is a well-nourished female, in no acute distress. The vital signs are documented above. CARDIOVASCULAR: 2+ radial and 2+ dorsalis pedis pulses bilaterally.  She does have large plexus of varicosities over the medial calf and distal thigh on the left.  None on the right PULMONARY: There is good air exchange  ABDOMEN: Soft and non-tender  MUSCULOSKELETAL: There are no major deformities or cyanosis. NEUROLOGIC: No focal weakness or paresthesias are detected. SKIN: There are no ulcers or rashes noted. PSYCHIATRIC: The patient has a normal affect.  DATA:  Noninvasive studies from our office shows reflux throughout her left great saphenous vein extending these large varicosities.  No evidence of DVT  MEDICAL ISSUES: Pain and swelling associated with venous varicosities.  Discussed options with the patient.  She has worn support hose but not thigh-high compression.  We have fitted with her today 2030 mmHg.  Also understands the importance of elevation when possible and ibuprofen for discomfort.  She will see [place] back in the office with Dr. Durwin Nora or fields in 3 months to discuss other treatment options if she fails conservative treatment   Larina Earthly, MD [facility] Specialists of [facility] Tel [phone] Pager [phone]

## 2017-11-26 ENCOUNTER — Encounter (HOSPITAL_COMMUNITY): Payer: Self-pay

## 2017-11-26 ENCOUNTER — Encounter: Payer: BC Managed Care – PPO | Admitting: Vascular Surgery

## 2018-02-23 ENCOUNTER — Ambulatory Visit: Payer: Self-pay | Admitting: Vascular Surgery

## 2018-02-25 ENCOUNTER — Ambulatory Visit: Payer: Self-pay | Admitting: Vascular Surgery

## 2018-03-02 ENCOUNTER — Encounter: Payer: Self-pay | Admitting: Vascular Surgery

## 2018-03-02 ENCOUNTER — Other Ambulatory Visit: Payer: Self-pay

## 2018-03-02 ENCOUNTER — Ambulatory Visit: Payer: BC Managed Care – PPO | Admitting: Vascular Surgery

## 2018-03-02 VITALS — BP 116/77 | HR 85 | Resp 18 | Ht 62.0 in | Wt 214.0 lb

## 2018-03-02 DIAGNOSIS — I83812 Varicose veins of left lower extremities with pain: Secondary | ICD-10-CM

## 2018-03-02 NOTE — Progress Notes (Signed)
Patient is a 38 year old female who returns for follow-up today.  She was last seen by my partner Dr. Arbie Cookey November 2019.  At that time she complained of painful varicosities in her left leg.  She was given a prescription for thigh-high 20 to 30 mm compression stockings.  She states she has had some improvement in the pain and swelling but not complete resolution.  She also states that despite wearing the compression therapy the veins in her legs have become more prominent over the last few months.  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  History reviewed. No pertinent past medical history.  Past Surgical History:  Procedure Laterality Date  . CESAREAN SECTION      Review of systems: She denies shortness of breath she denies chest pain.  Physical exam:  Vitals:   03/02/18 1444  BP: 116/77  Pulse: 85  Resp: 18  SpO2: 96%  Weight: 214 lb (97.1 kg)  Height: 5\' 2"  (1.575 m)   Extremities: 2+ dorsalis pedis pulses bilaterally  Skin: Large varicosities 4 to 5 mm diameter primarily clustered around the left knee      Data: Venous duplex ultrasound was performed on November 17, 2017.  I reviewed the study today.  Showed diffuse reflux in the left greater saphenous vein.  5 to 6 mm and sometimes as large as 8 mm vein diameter.  There is no evidence of DVT.  I repeated portions of her ultrasound today at the bedside with the SonoSite.  This shows a saphenous vein in the left leg of 4 to 5 mm diameter  Assessment: Symptomatic varicose veins with pain slowly progressively worse despite compression therapy.  Plan: We will schedule the patient for left leg laser ablation with stab avulsions in the near future pending insurance approval.  Fabienne Bruns, MD [facility] of [place] Office: [phone] Pager: [phone]

## 2018-03-08 ENCOUNTER — Other Ambulatory Visit: Payer: Self-pay | Admitting: *Deleted

## 2018-03-08 DIAGNOSIS — I83812 Varicose veins of left lower extremities with pain: Secondary | ICD-10-CM

## 2018-04-27 ENCOUNTER — Other Ambulatory Visit: Payer: Self-pay | Admitting: Vascular Surgery

## 2018-05-04 ENCOUNTER — Encounter (HOSPITAL_COMMUNITY): Payer: Self-pay

## 2018-05-04 ENCOUNTER — Ambulatory Visit: Payer: Self-pay | Admitting: Vascular Surgery

## 2018-08-10 IMAGING — DX DG FOREARM 2V*L*
2 series · 2 of 2 positions shown · non-contrast
Comparison: None.

CLINICAL DATA: Laceration, evaluate for foreign body

EXAM:
LEFT FOREARM - 2 VIEW

[forearm ap]
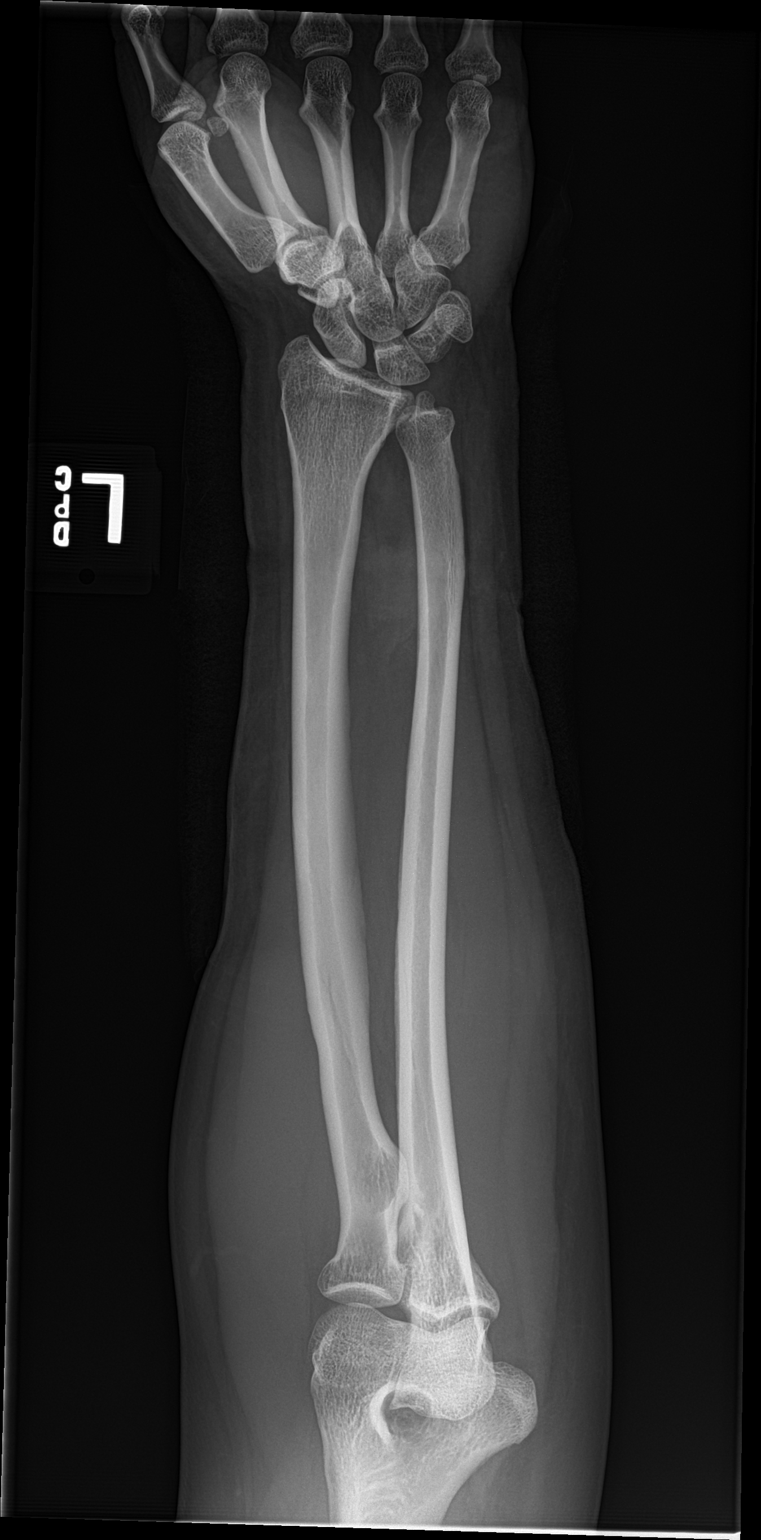

[forearm lat]
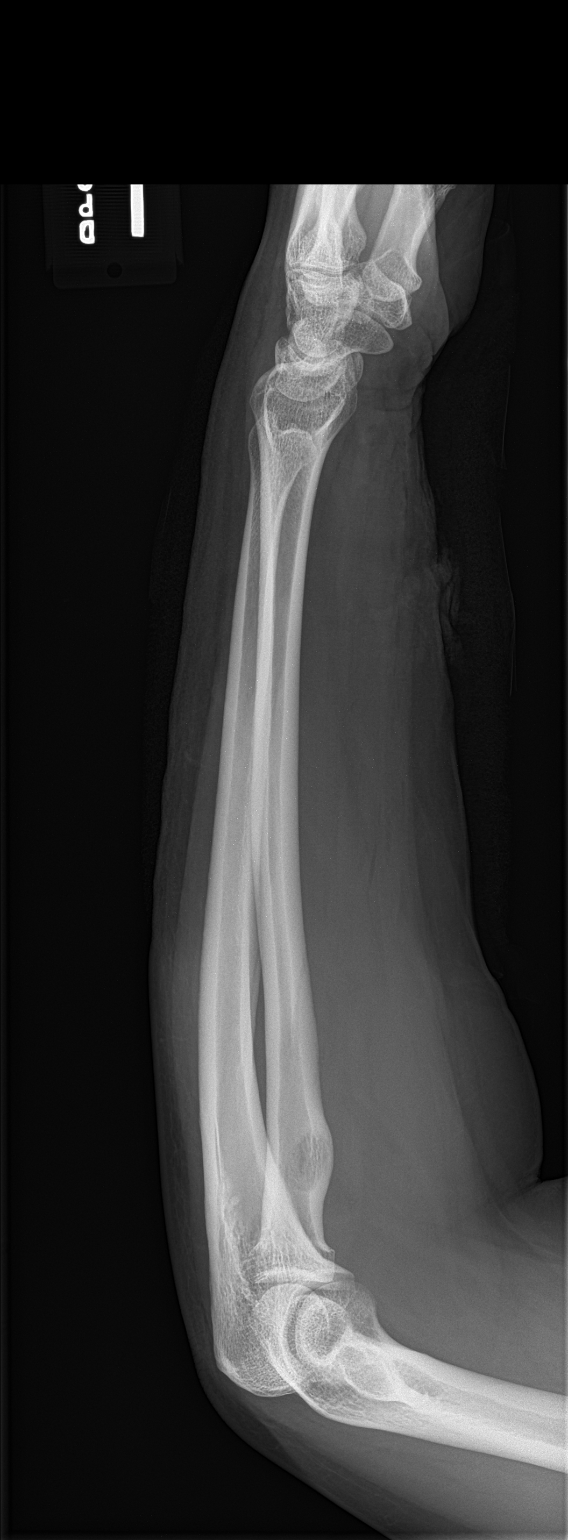

[2 of 2 positions shown; findings below may reference images not displayed]

FINDINGS: No fracture or malalignment. No radiopaque foreign body. Soft tissue
injury along the volar aspect of the distal forearm.
IMPRESSION: No acute osseous abnormality

## 2019-01-18 ENCOUNTER — Encounter: Payer: Self-pay | Admitting: Vascular Surgery

## 2019-06-23 ENCOUNTER — Other Ambulatory Visit (HOSPITAL_COMMUNITY): Payer: Self-pay | Admitting: Family Medicine

## 2019-06-23 DIAGNOSIS — Z1231 Encounter for screening mammogram for malignant neoplasm of breast: Secondary | ICD-10-CM

## 2019-07-10 ENCOUNTER — Other Ambulatory Visit: Payer: Self-pay

## 2019-07-10 ENCOUNTER — Ambulatory Visit (HOSPITAL_COMMUNITY)
Admission: RE | Admit: 2019-07-10 | Discharge: 2019-07-10 | Disposition: A | Discharge status: Home / Self Care | Payer: BC Managed Care – PPO | Source: Ambulatory Visit | Attending: Family Medicine | Admitting: Family Medicine

## 2019-07-10 DIAGNOSIS — Z1231 Encounter for screening mammogram for malignant neoplasm of breast: Secondary | ICD-10-CM | POA: Diagnosis present

## 2020-06-17 ENCOUNTER — Other Ambulatory Visit (HOSPITAL_COMMUNITY): Payer: Self-pay | Admitting: Family Medicine

## 2020-06-17 DIAGNOSIS — Z1231 Encounter for screening mammogram for malignant neoplasm of breast: Secondary | ICD-10-CM

## 2020-07-15 ENCOUNTER — Other Ambulatory Visit: Payer: Self-pay

## 2020-07-15 ENCOUNTER — Ambulatory Visit (HOSPITAL_COMMUNITY)
Admission: RE | Admit: 2020-07-15 | Discharge: 2020-07-15 | Disposition: A | Discharge status: Home / Self Care | Payer: BC Managed Care – PPO | Source: Ambulatory Visit | Attending: Family Medicine | Admitting: Family Medicine

## 2020-07-15 DIAGNOSIS — Z1231 Encounter for screening mammogram for malignant neoplasm of breast: Secondary | ICD-10-CM | POA: Insufficient documentation

## 2021-02-03 ENCOUNTER — Other Ambulatory Visit (HOSPITAL_COMMUNITY): Payer: Self-pay | Admitting: Family Medicine

## 2021-02-03 ENCOUNTER — Other Ambulatory Visit: Payer: Self-pay | Admitting: Family Medicine

## 2021-02-03 DIAGNOSIS — N979 Female infertility, unspecified: Secondary | ICD-10-CM

## 2021-02-10 ENCOUNTER — Ambulatory Visit (HOSPITAL_COMMUNITY): Admission: RE | Admit: 2021-02-10 | Payer: BC Managed Care – PPO | Source: Ambulatory Visit

## 2021-02-10 ENCOUNTER — Encounter (HOSPITAL_COMMUNITY): Payer: Self-pay

## 2021-11-17 ENCOUNTER — Ambulatory Visit (HOSPITAL_COMMUNITY)
Admission: RE | Admit: 2021-11-17 | Discharge: 2021-11-17 | Disposition: A | Discharge status: Home / Self Care | Payer: BC Managed Care – PPO | Source: Ambulatory Visit | Attending: Family Medicine | Admitting: Family Medicine

## 2021-11-17 ENCOUNTER — Other Ambulatory Visit (HOSPITAL_COMMUNITY): Payer: Self-pay | Admitting: Family Medicine

## 2021-11-17 DIAGNOSIS — Z1231 Encounter for screening mammogram for malignant neoplasm of breast: Secondary | ICD-10-CM

## 2021-11-17 DIAGNOSIS — M79672 Pain in left foot: Secondary | ICD-10-CM | POA: Insufficient documentation

## 2021-11-19 ENCOUNTER — Inpatient Hospital Stay (HOSPITAL_COMMUNITY): Admission: RE | Admit: 2021-11-19 | Payer: BC Managed Care – PPO | Source: Ambulatory Visit

## 2021-11-19 DIAGNOSIS — Z1231 Encounter for screening mammogram for malignant neoplasm of breast: Secondary | ICD-10-CM

## 2022-06-23 IMAGING — MG MM DIGITAL SCREENING BILAT W/ TOMO AND CAD
6 of 10 series · 6 of 30 positions shown · non-contrast
Comparison: Previous exam(s).

CLINICAL DATA: Screening.

EXAM:
DIGITAL SCREENING BILATERAL MAMMOGRAM WITH TOMOSYNTHESIS AND CAD
TECHNIQUE: Bilateral screening digital craniocaudal and mediolateral oblique
mammograms were obtained. Bilateral screening digital breast
tomosynthesis was performed. The images were evaluated with
computer-aided detection.

[R CC synth-2D]
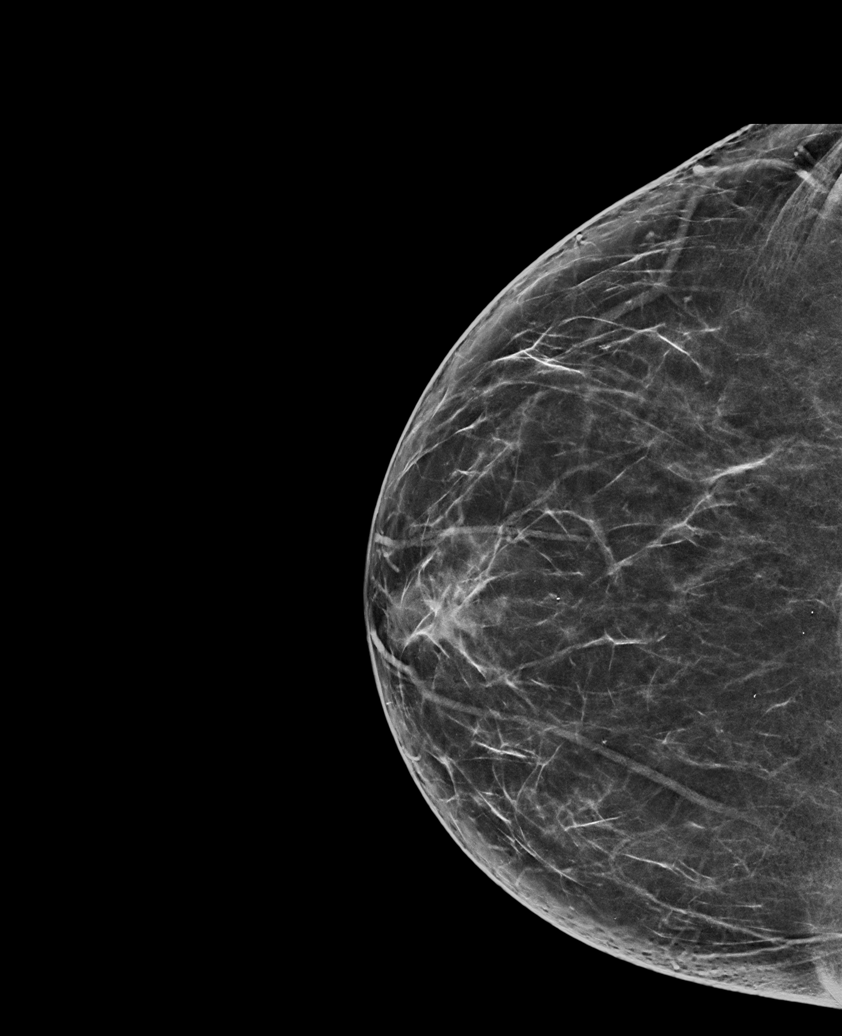

[R MLO synth-2D]
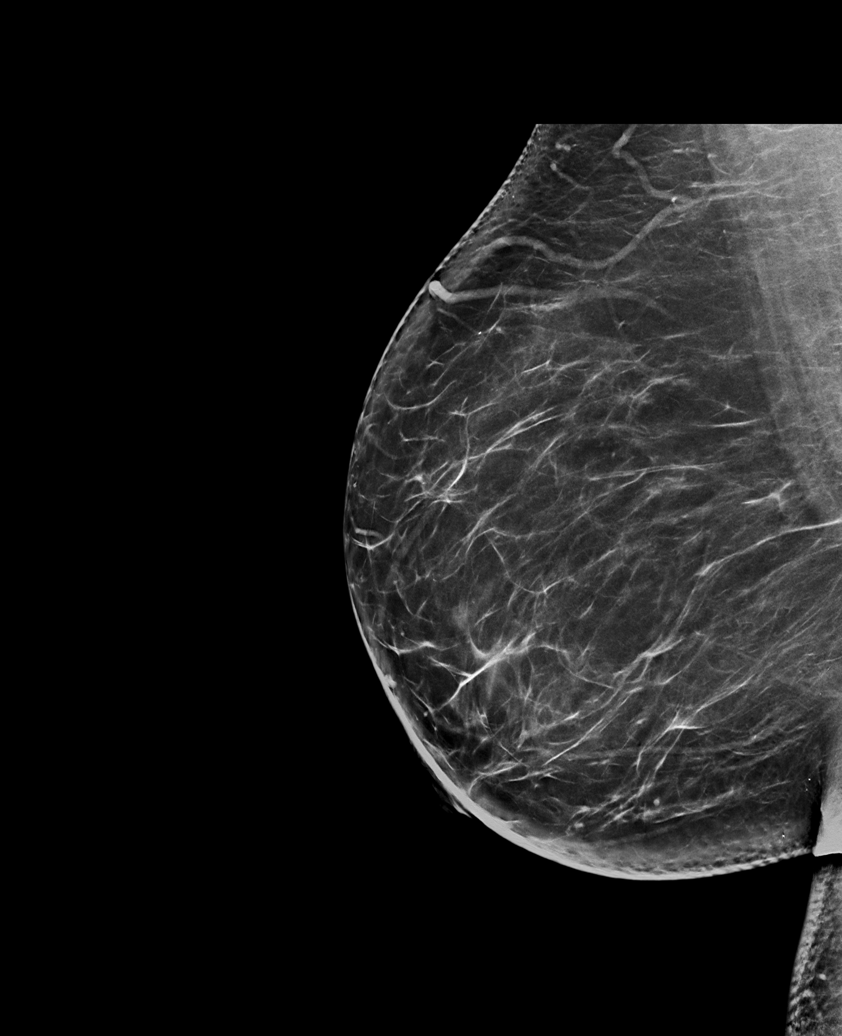

[L CC synth-2D (1 of 2)]
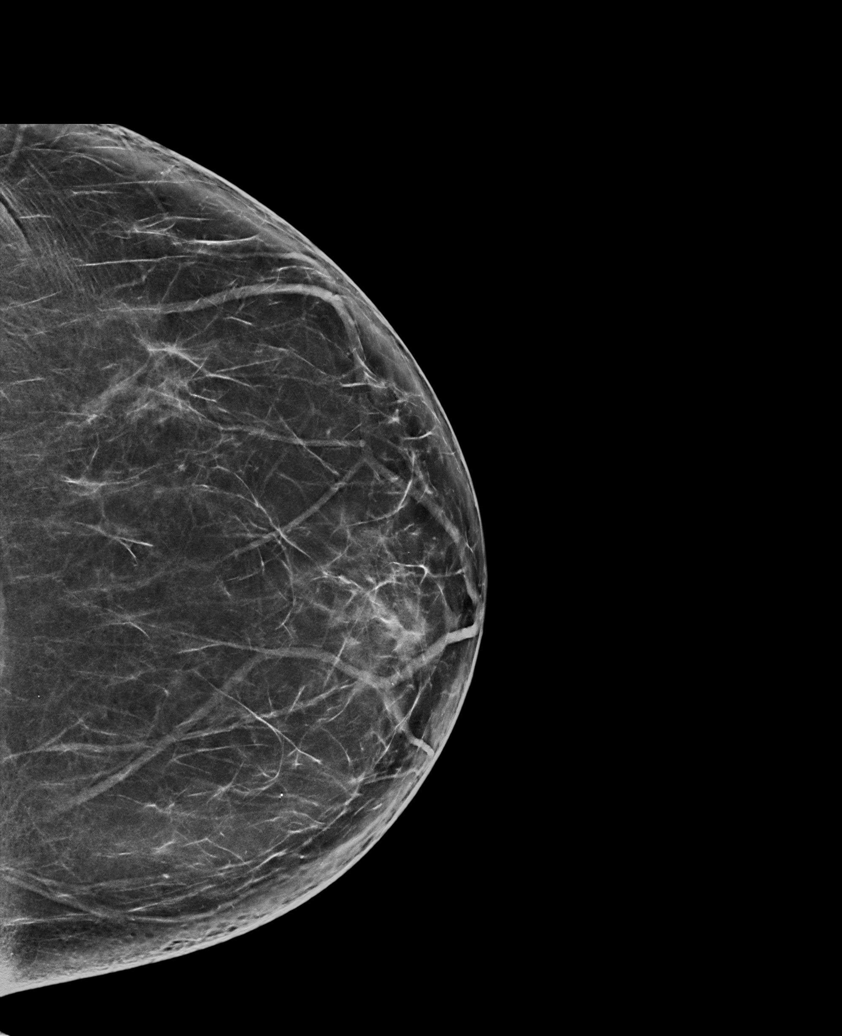

[L MLO synth-2D]
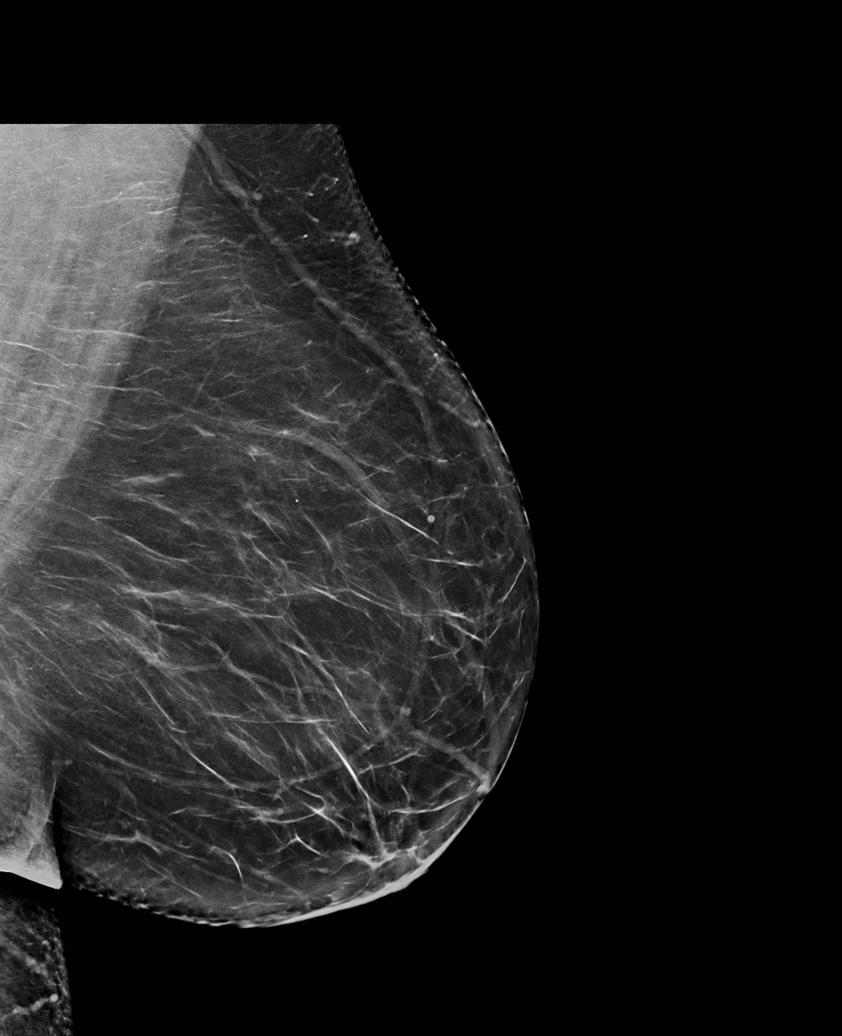

[L CC synth-2D (2 of 2)]
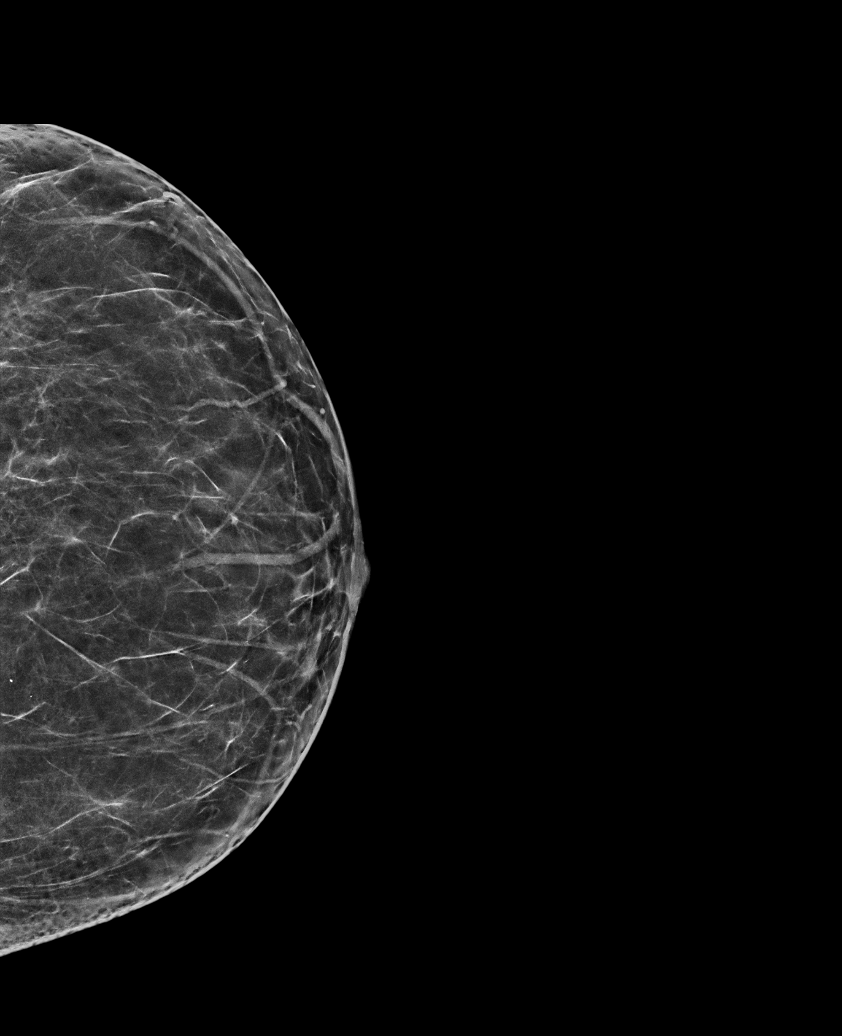

[L CC tomo · tomo slice 34/67.0]
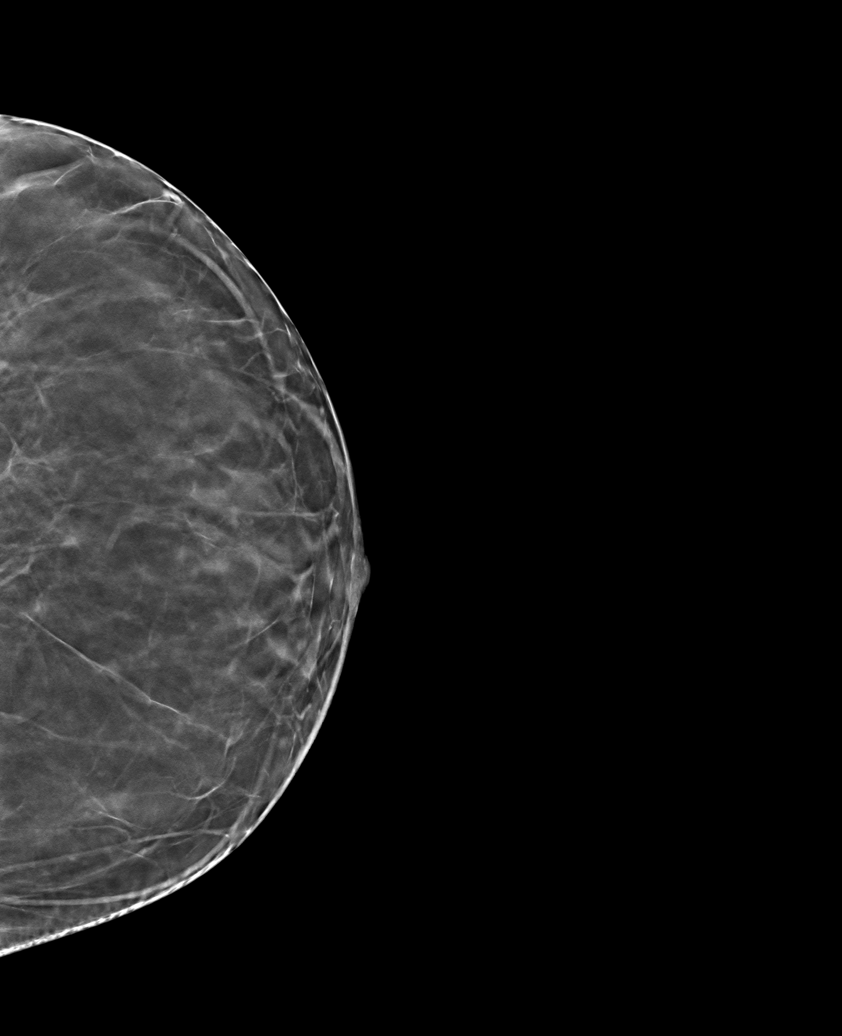

[6 of 30 positions shown; findings below may reference images not displayed]

ACR Breast Density Category b: There are scattered areas of
fibroglandular density.
FINDINGS: There are no findings suspicious for malignancy.
IMPRESSION: No mammographic evidence of malignancy. A result letter of this
screening mammogram will be mailed directly to the patient.

RECOMMENDATION:
Screening mammogram in one year. (Code:51-O-LD2)

BI-RADS CATEGORY  1: Negative.

## 2022-08-13 ENCOUNTER — Other Ambulatory Visit: Payer: Self-pay | Admitting: *Deleted

## 2022-08-13 DIAGNOSIS — I8393 Asymptomatic varicose veins of bilateral lower extremities: Secondary | ICD-10-CM

## 2022-09-01 NOTE — Progress Notes (Unsigned)
Requested by:  Assunta Found, MD [address] [place],  [place] [address]  Reason for consultation: varicose veins    History of Present Illness   Sherry Wilson is a 42 y.o. ([birth year]) female who presents for repeat evaluation of symptomatic varicose veins. She has a history of painful left lower extremity varicosities and was last seen by Dr.Fields in 2020. At that time the patient was going to be scheduled for left GSV ablation with stab avulsions pending insurance approval.   She hasn't been seen since??  Venous symptoms include: (aching, heavy, tired, throbbing, burning, itching, swelling, bleeding, ulcer)  *** Onset/duration:  ***  Occupation:  *** Aggravating factors: (sitting, standing) Alleviating factors: (elevation) Compression:  *** Helps:  *** Pain medications:  *** Previous vein procedures:  *** History of DVT:  ***  No past medical history on file.  Past Surgical History:  Procedure Laterality Date   CESAREAN SECTION      Social History   Socioeconomic History   Marital status: Married    Spouse name: Not on file   Number of children: Not on file   Years of education: Not on file   Highest education level: Not on file  Occupational History   Not on file  Tobacco Use   Smoking status: Never   Smokeless tobacco: Never  Substance and Sexual Activity   Alcohol use: No   Drug use: No   Sexual activity: Not on file  Other Topics Concern   Not on file  Social History Narrative   Not on file   Social Determinants of Health   Financial Resource Strain: Not on file  Food Insecurity: Not on file  Transportation Needs: Not on file  Physical Activity: Not on file  Stress: Not on file  Social Connections: Not on file  Intimate Partner Violence: Not on file   ***No family history on file.  Current Outpatient Medications  Medication Sig Dispense Refill   cyclobenzaprine (FLEXERIL) 5 MG tablet Take 5 mg by mouth 3 (three) times daily as  needed for muscle spasms.     No current facility-administered medications for this visit.    Allergies  Allergen Reactions   Shellfish Allergy Anaphylaxis   Penicillins Nausea And Vomiting    .Has patient had a PCN reaction causing immediate rash, facial/tongue/throat swelling, SOB or lightheadedness with hypotension: No Has patient had a PCN reaction causing severe rash involving mucus membranes or skin necrosis: No Has patient had a PCN reaction that required hospitalization: No Has patient had a PCN reaction occurring within the last 10 years: No If all of the above answers are "NO", then may proceed with Cephalosporin use.     ***REVIEW OF SYSTEMS (negative unless checked):   Cardiac:  []  Chest pain or chest pressure? []  Shortness of breath upon activity? []  Shortness of breath when lying flat? []  Irregular heart rhythm?  Vascular:  []  Pain in calf, thigh, or hip brought on by walking? []  Pain in feet at night that wakes you up from your sleep? []  Blood clot in your veins? []  Leg swelling?  Pulmonary:  []  Oxygen at home? []  Productive cough? []  Wheezing?  Neurologic:  []  Sudden weakness in arms or legs? []  Sudden numbness in arms or legs? []  Sudden onset of difficult speaking or slurred speech? []  Temporary loss of vision in one eye? []  Problems with dizziness?  Gastrointestinal:  []  Blood in stool? []  Vomited blood?  Genitourinary:  []  Burning when urinating? []   Blood in urine?  Psychiatric:  []  Major depression  Hematologic:  []  Bleeding problems? []  Problems with blood clotting?  Dermatologic:  []  Rashes or ulcers?  Constitutional:  []  Fever or chills?  Ear/Nose/Throat:  []  Change in hearing? []  Nose bleeds? []  Sore throat?  Musculoskeletal:  []  Back pain? []  Joint pain? []  Muscle pain?   Physical Examination    There were no vitals filed for this visit. There is no height or weight on file to calculate BMI.  General:  WDWN in  NAD; vital signs documented above Gait: Not observed HENT: WNL, normocephalic Pulmonary: normal non-labored breathing , without Rales, rhonchi,  wheezing Cardiac: {Desc; regular/irreg:14544} HR, without  Murmurs {With/Without:20273} carotid bruit*** Abdomen: soft, NT, no masses Skin: {With/Without:20273} rashes Vascular Exam/Pulses:  Right Left  Radial {Exam; arterial pulse strength 0-4:30167} {Exam; arterial pulse strength 0-4:30167}  Ulnar {Exam; arterial pulse strength 0-4:30167} {Exam; arterial pulse strength 0-4:30167}  Femoral {Exam; arterial pulse strength 0-4:30167} {Exam; arterial pulse strength 0-4:30167}  Popliteal {Exam; arterial pulse strength 0-4:30167} {Exam; arterial pulse strength 0-4:30167}  DP {Exam; arterial pulse strength 0-4:30167} {Exam; arterial pulse strength 0-4:30167}  PT {Exam; arterial pulse strength 0-4:30167} {Exam; arterial pulse strength 0-4:30167}   Extremities: {With/Without:20273} varicose veins, {With/Without:20273} reticular veins, {With/Without:20273} edema, {With/Without:20273} stasis pigmentation, {With/Without:20273} lipodermatosclerosis, {With/Without:20273} ulcers Musculoskeletal: no muscle wasting or atrophy  Neurologic: A&O X 3;  No focal weakness or paresthesias are detected Psychiatric:  The pt has {Desc; normal/abnormal:11317::"Normal"} affect.  Non-invasive Vascular Imaging   BLE Venous Insufficiency Duplex (09/03/2022):  RLE:  *** DVT and SVT,  *** GSV reflux ***, GSV diameter *** *** SSV reflux ***, *** deep venous reflux  LLE: *** DVT and SVT,  *** GSV reflux ***,  GSV diameter *** *** SSV reflux ***, *** deep venous reflux   Medical Decision Making   Sherry Wilson is a 42 y.o. female who presents with: ***LE chronic venous insufficiency, ***varicose veins with complications  Based on the patient's history and examination, I recommend: ***. I discussed with the patient the use of her 20-30 mm thigh high compression  stockings and need for 3 month trial of such. The patient will follow up in 3 months with Dr. Marland Kitchen Thank you for allowing [place] to participate in this patient's care.   Korinne Greenstein Sharin Mons, PA-C [facility] Office: [phone]  09/01/2022, 11:31 AM  Clinic MD: ***

## 2022-09-03 ENCOUNTER — Ambulatory Visit (HOSPITAL_COMMUNITY)
Admission: RE | Admit: 2022-09-03 | Discharge: 2022-09-03 | Disposition: A | Discharge status: Home / Self Care | Payer: BC Managed Care – PPO | Source: Ambulatory Visit | Attending: Vascular Surgery | Admitting: Vascular Surgery

## 2022-09-03 ENCOUNTER — Ambulatory Visit: Payer: BC Managed Care – PPO | Admitting: Physician Assistant

## 2022-09-03 VITALS — BP 120/81 | HR 71 | Temp 98.4°F | Resp 18 | Ht 62.0 in | Wt 223.0 lb

## 2022-09-03 DIAGNOSIS — I8393 Asymptomatic varicose veins of bilateral lower extremities: Secondary | ICD-10-CM | POA: Insufficient documentation

## 2022-09-03 DIAGNOSIS — I872 Venous insufficiency (chronic) (peripheral): Secondary | ICD-10-CM | POA: Diagnosis not present

## 2022-10-09 ENCOUNTER — Other Ambulatory Visit: Payer: Self-pay

## 2022-10-09 ENCOUNTER — Encounter (HOSPITAL_COMMUNITY): Payer: Self-pay

## 2022-10-09 ENCOUNTER — Emergency Department (HOSPITAL_COMMUNITY)
Admission: EM | Admit: 2022-10-09 | Discharge: 2022-10-10 | Disposition: A | Discharge status: Home / Self Care | Payer: BC Managed Care – PPO | Attending: Emergency Medicine | Admitting: Emergency Medicine

## 2022-10-09 ENCOUNTER — Emergency Department (HOSPITAL_COMMUNITY): Payer: BC Managed Care – PPO

## 2022-10-09 DIAGNOSIS — S7001XA Contusion of right hip, initial encounter: Secondary | ICD-10-CM | POA: Insufficient documentation

## 2022-10-09 DIAGNOSIS — W228XXA Striking against or struck by other objects, initial encounter: Secondary | ICD-10-CM | POA: Insufficient documentation

## 2022-10-09 DIAGNOSIS — Y990 Civilian activity done for income or pay: Secondary | ICD-10-CM | POA: Diagnosis not present

## 2022-10-09 DIAGNOSIS — M25551 Pain in right hip: Secondary | ICD-10-CM | POA: Diagnosis present

## 2022-10-09 NOTE — ED Triage Notes (Signed)
Pt states falling over a cart at work and the cart hitting her rt hip. Pt states [facility] went to [facility] but they sent her to the ED because "they couldn't stabilize her hip." Pt walked to triage and standing in triage stating "I don't believe it is broke but severely bruised."

## 2022-10-10 NOTE — ED Provider Notes (Signed)
[place] EMERGENCY DEPARTMENT AT [facility] Provider Note   CSN: [id number] Arrival date & time: 10/09/22  1942     History  Chief Complaint  Patient presents with   Hip Pain    Sherry Wilson is a 42 y.o. female.  Patient is a 42 year old female presenting with complaints of right hip pain.  She reports getting items out of the freezer at work when someone turned off the lights.  Patient became panicked, then ran towards the door at which time she struck her right hip on a cart.  She describes soreness throughout her body, but mostly pain in her right hip.  She is able to ambulate, but with discomfort.  No alleviating factors.  She has not tried anything for the pain.  She was told by her employer to be evaluated.  The history is provided by the patient.       Home Medications Prior to Admission medications   Medication Sig Start Date End Date Taking? Authorizing Provider  cyclobenzaprine (FLEXERIL) 5 MG tablet Take 5 mg by mouth 3 (three) times daily as needed for muscle spasms. Patient not taking: Reported on 09/03/2022    [provider]  VITAMIN D, ERGOCALCIFEROL, PO Take by mouth. 08/06/22   [provider]      Allergies    Shellfish allergy, Latex, and Penicillins    Review of Systems   Review of Systems  All other systems reviewed and are negative.   Physical Exam Updated Vital Signs BP 130/88 (BP Location: Right Arm)   Pulse 78   Temp 98.6 F (37 C) (Oral)   Resp 18   Ht 5\' 3"  (1.6 m)   Wt 102.5 kg   LMP 10/02/2022 (Exact Date)   SpO2 98%   BMI 40.03 kg/m  Physical Exam Vitals and nursing note reviewed.  Constitutional:      Appearance: Normal appearance.  Pulmonary:     Effort: Pulmonary effort is normal.  Musculoskeletal:     Comments: The right hip is grossly normal in appearance.  There is mild tenderness over the lateral aspect of the hip, but no palpable abnormality.  Distal PMS is intact.  Skin:     General: Skin is warm and dry.  Neurological:     Mental Status: She is alert and oriented to person, place, and time.     ED Results / Procedures / Treatments   Labs (all labs ordered are listed, but only abnormal results are displayed) Labs Reviewed - No data to display  EKG None  Radiology DG Hip Unilat  With Pelvis 2-3 Views Right  Result Date: 10/09/2022 CLINICAL DATA:  Hip pain. EXAM: DG HIP (WITH OR WITHOUT PELVIS) 2-3V RIGHT COMPARISON:  None Available. FINDINGS: There is no evidence of hip fracture or dislocation. There is no evidence of arthropathy or other focal bone abnormality. IMPRESSION: Negative radiographs of the pelvis or right hip. Electronically Signed   By: Narda Rutherford M.D.   On: 10/09/2022 22:00    Procedures Procedures    Medications Ordered in ED Medications - No data to display  ED Course/ Medical Decision Making/ A&P  Patient presenting with complaints of right hip pain as described in the HPI.  X-rays are negative.  This is clearly a contusion.  Patient to rest, take ibuprofen, and follow-up as needed if not improving.  Final Clinical Impression(s) / ED Diagnoses Final diagnoses:  None    Rx / DC Orders ED Discharge Orders  None         Geoffery Lyons, MD 10/10/22 [phone]

## 2022-10-10 NOTE — Discharge Instructions (Signed)
Rest.  Take ibuprofen 600 mg every 6 hours as needed for pain.  Follow-up with primary doctor if not improving in the next week.

## 2022-10-21 ENCOUNTER — Ambulatory Visit: Payer: BC Managed Care – PPO | Admitting: Vascular Surgery

## 2022-11-27 ENCOUNTER — Other Ambulatory Visit (HOSPITAL_COMMUNITY): Payer: Self-pay | Admitting: Family Medicine

## 2022-11-27 DIAGNOSIS — Z1231 Encounter for screening mammogram for malignant neoplasm of breast: Secondary | ICD-10-CM

## 2022-12-09 ENCOUNTER — Encounter: Payer: Self-pay | Admitting: Vascular Surgery

## 2022-12-09 ENCOUNTER — Ambulatory Visit: Payer: BC Managed Care – PPO | Admitting: Vascular Surgery

## 2022-12-09 VITALS — BP 119/71 | HR 73 | Temp 98.3°F | Resp 20 | Ht 63.0 in | Wt 222.0 lb

## 2022-12-09 DIAGNOSIS — I8393 Asymptomatic varicose veins of bilateral lower extremities: Secondary | ICD-10-CM | POA: Diagnosis not present

## 2022-12-09 NOTE — Progress Notes (Signed)
Patient ID: Sherry Wilson, female   DOB: 06-Oct-1980, 42 y.o.   MRN: [id number]  Reason for Consult: Follow-up   Referred by Assunta Found, MD  Subjective:     HPI:  Sherry Wilson is a 42 y.o. female history of symptomatic left lower extremity varicose veins.  She also has associated swelling which is improved with wearing compression stockings which she has been religious in wearing for several years now.  She does have continued pain particularly at the size of the varicosities.  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  She denies any history of DVT has never had any venous intervention in the past but she does have several family members who have had intervention.  She was scheduled for laser ablation and stab phlebectomy this was canceled due to COVID.  History reviewed. No pertinent past medical history. History reviewed. No pertinent family history. Past Surgical History:  Procedure Laterality Date   CESAREAN SECTION      Short Social History:  Social History   Tobacco Use   Smoking status: Never   Smokeless tobacco: Never  Substance Use Topics   Alcohol use: No    Allergies  Allergen Reactions   Shellfish Allergy Anaphylaxis   Latex    Penicillins Nausea And Vomiting    .Has patient had a PCN reaction causing immediate rash, facial/tongue/throat swelling, SOB or lightheadedness with hypotension: No Has patient had a PCN reaction causing severe rash involving mucus membranes or skin necrosis: No Has patient had a PCN reaction that required hospitalization: No Has patient had a PCN reaction occurring within the last 10 years: No If all of the above answers are "NO", then may proceed with Cephalosporin use.     Current Outpatient Medications  Medication Sig Dispense Refill   cyclobenzaprine (FLEXERIL) 5 MG tablet Take 5 mg by mouth 3 (three) times daily as needed for muscle spasms.     VITAMIN D,  ERGOCALCIFEROL, PO Take by mouth.     No current facility-administered medications for this visit.    Review of Systems  Constitutional:  Constitutional negative. HENT: HENT negative.  Eyes: Eyes negative.  Respiratory: Respiratory negative.  Cardiovascular: Positive for leg swelling.  GI: Gastrointestinal negative.  Musculoskeletal: Positive for leg pain.  Neurological: Neurological negative. Hematologic: Hematologic/lymphatic negative.  Psychiatric: Psychiatric negative.        Objective:  Objective   Vitals:   12/09/22 1532  BP: 119/71  Pulse: 73  Resp: 20  Temp: 98.3 F (36.8 C)  SpO2: 95%  Weight: 222 lb (100.7 kg)  Height: 5\' 3"  (1.6 m)   Body mass index is 39.33 kg/m.  Physical Exam HENT:     Head: Normocephalic.     Nose: Nose normal.  Eyes:     Pupils: Pupils are equal, round, and reactive to light.  Cardiovascular:     Rate and Rhythm: Normal rate.  Pulmonary:     Effort: Pulmonary effort is normal.  Abdominal:     General: Abdomen is flat.  Musculoskeletal:     Cervical back: Neck supple.     Right lower leg: Edema present.     Left lower leg: Edema present.  Skin:    General: Skin is warm.     Capillary Refill: Capillary refill takes less than 2 seconds.  Neurological:     General: No focal deficit present.     Mental Status: She is alert.  Psychiatric:        Mood and Affect: Mood normal.     Data: LEFT          Reflux NoRefluxReflux TimeDiameter cmsComments                          Yes                                   +--------------+---------+------+-----------+------------+--------+  CFV                    yes   >1 second                       +--------------+---------+------+-----------+------------+--------+  FV mid        no                                              +--------------+---------+------+-----------+------------+--------+  Popliteal    no                                               +--------------+---------+------+-----------+------------+--------+  GSV at SFJ              yes    >500 ms      0.67              +--------------+---------+------+-----------+------------+--------+  GSV prox thigh          yes    >500 ms      0.84              +--------------+---------+------+-----------+------------+--------+  GSV mid thigh           yes    >500 ms      0.52              +--------------+---------+------+-----------+------------+--------+  GSV dist thigh          yes    >500 ms      0.41              +--------------+---------+------+-----------+------------+--------+  GSV at knee   no                            0.39              +--------------+---------+------+-----------+------------+--------+  GSV prox calf           yes    >500 ms      0.61              +--------------+---------+------+-----------+------------+--------+  SSV Pop Fossa no                            0.26              +--------------+---------+------+-----------+------------+--------+  SSV prox calf no                            0.38              +--------------+---------+------+-----------+------------+--------+  Summary:  Left:  - No evidence of deep vein thrombosis seen in the left lower extremity,  from the common femoral through the popliteal veins.  - No evidence of superficial venous thrombosis in the left lower  extremity.  - The deep venous system is incompetent at the SFJ.  - The great saphenous vein is grossly incompetent.  - The small saphenous vein is competent.   Great saphenous vein was evaluated at bedside appears to be out of the fascia up to the level of the mid thigh where it then enters the fascia is very large up to the saphenofemoral junction which was easily visualized.        Assessment/Plan:    42 year old female with C3 venous disease with associated painful varicosities of the left medial thigh and  knee as well as the anterior knee extending down onto the leg.  We have discussed her options being continued compression therapy versus intervention which would include left greater saphenous vein ablation and stab phlebectomy between 10 and 20.  She has elected for intervention given her level of discomfort with associated swelling.  We will get her scheduled in the near future for laser ablation beginning approximately mid thigh and stab phlebectomy.       Maeola Harman MD [facility]

## 2022-12-21 ENCOUNTER — Encounter (HOSPITAL_COMMUNITY): Payer: Self-pay

## 2022-12-21 ENCOUNTER — Ambulatory Visit (HOSPITAL_COMMUNITY)
Admission: RE | Admit: 2022-12-21 | Discharge: 2022-12-21 | Disposition: A | Discharge status: Home / Self Care | Payer: BC Managed Care – PPO | Source: Ambulatory Visit | Attending: Family Medicine | Admitting: Family Medicine

## 2022-12-21 DIAGNOSIS — Z1231 Encounter for screening mammogram for malignant neoplasm of breast: Secondary | ICD-10-CM | POA: Insufficient documentation

## 2023-02-09 ENCOUNTER — Other Ambulatory Visit: Payer: Self-pay | Admitting: *Deleted

## 2023-02-09 DIAGNOSIS — I83812 Varicose veins of left lower extremities with pain: Secondary | ICD-10-CM

## 2023-03-15 ENCOUNTER — Ambulatory Visit (HOSPITAL_COMMUNITY)
Admission: RE | Admit: 2023-03-15 | Discharge: 2023-03-15 | Disposition: A | Discharge status: Home / Self Care | Payer: 59 | Source: Ambulatory Visit | Attending: Family Medicine | Admitting: Family Medicine

## 2023-03-15 ENCOUNTER — Other Ambulatory Visit (HOSPITAL_COMMUNITY): Payer: Self-pay | Admitting: Family Medicine

## 2023-03-15 DIAGNOSIS — J189 Pneumonia, unspecified organism: Secondary | ICD-10-CM

## 2023-04-14 ENCOUNTER — Other Ambulatory Visit: Payer: Self-pay | Admitting: *Deleted

## 2023-04-14 MED ORDER — LORAZEPAM 1 MG PO TABS: ORAL_TABLET | ORAL | 0 refills | Status: DC

## 2023-04-21 ENCOUNTER — Telehealth: Payer: Self-pay

## 2023-04-21 NOTE — Telephone Encounter (Signed)
 Reached out to patient to see if she could move up the time of her vein procedure for tomorrow with Dr. Randie Heinz to 8am. No answer, I left her a voice message to call the office back.

## 2023-04-21 NOTE — Telephone Encounter (Signed)
 Spoke with patient, she is okay with moving procedure up to 8am arrival.

## 2023-04-22 ENCOUNTER — Encounter: Payer: Self-pay | Admitting: Vascular Surgery

## 2023-04-22 ENCOUNTER — Ambulatory Visit: Payer: Self-pay | Admitting: Vascular Surgery

## 2023-04-22 VITALS — BP 117/67 | HR 73 | Temp 98.0°F | Resp 16 | Ht 63.0 in | Wt 226.0 lb

## 2023-04-22 DIAGNOSIS — I83812 Varicose veins of left lower extremities with pain: Secondary | ICD-10-CM

## 2023-04-22 HISTORY — PX: LASER ABLATION: SHX1947

## 2023-04-22 NOTE — Progress Notes (Signed)
      Patient name: Sherry Wilson MRN: [id number] DOB: 1980/03/14 Sex: female  REASON FOR VISIT: Treatment of left lower extremity venous reflux for swelling and painful varicosities  HPI: Sherry Wilson is a 43 y.o. female history of C3 venous disease with painful varicosities of the left medial thigh and knee as well as the anterior knee extending down onto the left leg.  She has been compliant with thigh-high compression stockings we have discussed proceeding with great saphenous vein ablation and stab phlebectomy between 10 and 20 for which she presents today.  Current Outpatient Medications  Medication Sig Dispense Refill   LORazepam (ATIVAN) 1 MG tablet Take 1 tablet 30 to 60 minutes prior to leaving house on day of office surgery.  Bring second tablet with you to office on day of office surgery. 2 tablet 0   cyclobenzaprine (FLEXERIL) 5 MG tablet Take 5 mg by mouth 3 (three) times daily as needed for muscle spasms.     VITAMIN D, ERGOCALCIFEROL, PO Take by mouth.     No current facility-administered medications for this visit.    PHYSICAL EXAM: Vitals:   04/22/23 0856  BP: 117/67  Pulse: 73  Resp: 16  Temp: 98 F (36.7 C)  TempSrc: Temporal  SpO2: 95%  Weight: 226 lb (102.5 kg)  Height: 5\' 3"  (1.6 m)   Awake alert and oriented Nonlabored respirations Varicosities of the left lower extremity marked with the patient standing  PROCEDURE: Left greater saphenous vein ablation totaling 21 cm and stab phlebectomy of the left medial thigh and medial and anterior knee between 10 and 20 sites  TECHNIQUE: Mrs. Bannister was taken the procedure room where she was initially placed standing and the varicosities were marked on the skin with marker.  She was then laid supine and sterilely prepped and draped in the left lower extremity in usual fashion, timeout was called we all agreed on the left lower extremity.  Ultrasound was used to identify the great saphenous vein which was  outside the fascia and was deep enough for ablation from the knee all the way to the saphenofemoral junction.  The area around the knee was anesthetized and we cannulated just at the level of the knee with a micropuncture needle followed by wire and a sheath.  A J-wire was placed a few centimeters from the saphenofemoral junction and a 45 cm laser sheath was placed under ultrasound guidance.  Tumescent anesthesia was instilled along the level of the catheter and the laser was then placed.  Appropriate protective eyewear was placed on all participants in the room we then ablated the vein for a total of 21 cm.  At completion the common femoral vein was noted to be patent and compressible.  Attention was then turned to the multiple areas of varicosities of the left medial thigh and medial and anterior knee.  These areas were anesthetized and stab incisions were created and the veins were grasped with crochet hook and removed with hemostats.  Steri-Strips were applied and a sterile compressive wrap was placed.  The patient did tolerate the procedure without any complication.  Plan will be for follow-up in 2 weeks with post ablation duplex to rule out DVT.  Lemar Livings [facility] [phone]

## 2023-04-22 NOTE — Progress Notes (Signed)
     Laser Ablation Procedure    Date: 04/22/2023   Sherry Wilson DOB:[birth year]  Consent signed: Yes      Surgeon: Dr Lemar Livings  Procedure: Laser Ablation: left Greater Saphenous Vein  BP 117/67 (BP Location: Left Arm, Patient Position: Sitting, Cuff Size: Large)   Pulse 73   Temp 98 F (36.7 C) (Temporal)   Resp 16   Ht 5\' 3"  (1.6 m)   Wt 226 lb (102.5 kg)   SpO2 95%   BMI 40.03 kg/m   Tumescent Anesthesia: 10 cc 0.9% NaCl with 50 cc Lidocaine HCL 1%  and 15 cc 8.4% NaHCO3  Local Anesthesia: 500 cc Lidocaine HCL and NaHCO3 (ratio 2:1)  7 watts continuous mode     Total energy: 1061.3 joules    Total time: 151 sec Treatment Length 21cm  Laser Fiber Ref. #  [id number]     Lot # [id number]   Stab Phlebectomy: 10-20 Sites: Thigh and Calf  Patient tolerated procedure well  Notes:  All staff members wore facial masks and facial shields/goggles.  Patient took Ativan 1mg  at 7am prior to the procedure.  Description of Procedure: After marking the course of the secondary varicosities, the patient was placed on the operating table in the supine position, and the left leg was prepped and draped in sterile fashion.   Local anesthetic was administered and under ultrasound guidance the saphenous vein was accessed with a micro needle and guide wire; then the mirco puncture sheath was placed.  A guide wire was inserted saphenofemoral junction , followed by a 5 french sheath.  The position of the sheath and then the laser fiber below the junction was confirmed using the ultrasound.  Tumescent anesthesia was administered along the course of the saphenous vein using ultrasound guidance. The patient was placed in Trendelenburg position and protective laser glasses were placed on patient and staff, and the laser was fired at 7 watts continuous mode for a total of 1061.3 joules.  For stab phlebectomies, local anesthetic was administered at the previously marked varicosities, and tumescent  anesthesia was administered around the vessels.  Ten to 20 stab wounds were made using the tip of an 11 blade. And using the vein hook, the phlebectomies were performed using a hemostat to avulse the varicosities.  Adequate hemostasis was achieved.    Steri strips were applied to the stab wounds and ABD pads and thigh high compression stockings were applied.  Ace wrap bandages were applied over the phlebectomy sites and at the top of the saphenofemoral junction. Blood loss was less than 15 cc.  Discharge instructions reviewed with patient and hardcopy of discharge instructions given to patient to take home. The patient ambulated out of the operating room having tolerated the procedure well.

## 2023-05-12 ENCOUNTER — Ambulatory Visit (HOSPITAL_COMMUNITY)
Admission: RE | Admit: 2023-05-12 | Discharge: 2023-05-12 | Disposition: A | Discharge status: Home / Self Care | Payer: Self-pay | Source: Ambulatory Visit | Attending: Vascular Surgery | Admitting: Vascular Surgery

## 2023-05-12 ENCOUNTER — Encounter: Payer: Self-pay | Admitting: Vascular Surgery

## 2023-05-12 ENCOUNTER — Ambulatory Visit (INDEPENDENT_AMBULATORY_CARE_PROVIDER_SITE_OTHER): Payer: Self-pay | Admitting: Vascular Surgery

## 2023-05-12 VITALS — BP 116/78 | HR 76 | Temp 98.6°F | Ht 63.0 in | Wt 234.0 lb

## 2023-05-12 DIAGNOSIS — I83812 Varicose veins of left lower extremities with pain: Secondary | ICD-10-CM | POA: Diagnosis present

## 2023-05-12 NOTE — Progress Notes (Signed)
 Subjective:     Patient ID: Sherry Wilson, female   DOB: [birth year], 43 y.o.   MRN: [id number]  HPI 43 year old female follows up 3 weeks after left greater saphenous vein ablation and stab phlebectomy of her left medial thigh and medial and anterior knee.  She has returned to work at [employer] and is wearing compression stockings.  Pain mostly resolving.   Review of Systems Left lower extremity pain resolving    Objective:   Physical Exam Vitals:   05/12/23 0931  BP: 116/78  Pulse: 76  Temp: 98.6 F (37 C)  SpO2: 95%       LEFT          Reflux NoRefluxReflux TimeDiameter cmsComments                                        Yes                                                 +--------------+---------+------+-----------+------------+-----------------  ----+  CFV          no                                                            +--------------+---------+------+-----------+------------+-----------------  ----+  FV prox       no                                                            +--------------+---------+------+-----------+------------+-----------------  ----+  FV mid        no                                                            +--------------+---------+------+-----------+------------+-----------------  ----+  FV dist       no                                                            +--------------+---------+------+-----------+------------+-----------------  ----+  Popliteal    no                                                            +--------------+---------+------+-----------+------------+-----------------  ----+  GSV at [facility]  EHIT 1 Thrombus  at                                                         junction, does  not                                                         extend into CFV          +--------------+---------+------+-----------+------------+-----------------  ----+  GSV prox thigh                                      prior                                                                        ablation/stripping     +--------------+---------+------+-----------+------------+-----------------  ----+  GSV mid thigh                                       prior                                                                        ablation/stripping     +--------------+---------+------+-----------+------------+-----------------  ----+  GSV dist thigh                                      prior                                                                        ablation/stripping     +--------------+---------+------+-----------+------------+-----------------  ----+  GSV at knee             yes    >500 ms      0.23                            +--------------+---------+------+-----------+------------+-----------------  ----+  GSV prox calf           yes    >500 ms      0.26                            +--------------+---------+------+-----------+------------+-----------------  ----+  GSV mid calf            yes    >500 ms      0.29                            +--------------+---------+------+-----------+------------+-----------------  ----+  GSV dist calf           yes    >500 ms      0.29                            +--------------+---------+------+-----------+------------+-----------------  ----+  SSV Pop Fossa no                            0.17                            +--------------+---------+------+-----------+------------+-----------------  ----+     Summary:  Left:  - No evidence of deep vein thrombosis within the lower extremity.  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  Thrombus does not extend into the common femoral vein.  - Great  saphenous vein demonstrates no flow from the saphenofemoral  junction to the distal thigh consistent with prior ablation.  - Great saphenous vein from knee to distal calf remains patent with  reflux.        Assessment/plan     43 year old female status post left greater saphenous vein ablation and stab phlebectomy now recovering well with EHIT 1 on today's follow-up duplex.  I have asked her to take 81 mg of aspirin daily and I will schedule her for 1 month repeat duplex to evaluate the E.  Unfortunately she had a large out-of-pocket expense for the duplex today and there is low likelihood for extension of EHIT 1 so I discussed with her that we can cancel the 1 month follow-up duplex if she is progressing well without any new complaints.      Sherry Woodside C. Sherry Graves, MD [facility] [place] Office: [phone] Pager: [phone]

## 2023-06-21 ENCOUNTER — Other Ambulatory Visit: Payer: Self-pay

## 2023-06-21 DIAGNOSIS — I872 Venous insufficiency (chronic) (peripheral): Secondary | ICD-10-CM

## 2023-07-07 ENCOUNTER — Ambulatory Visit (HOSPITAL_COMMUNITY)
Admission: RE | Admit: 2023-07-07 | Discharge: 2023-07-07 | Disposition: A | Discharge status: Home / Self Care | Source: Ambulatory Visit | Attending: Vascular Surgery | Admitting: Vascular Surgery

## 2023-07-07 ENCOUNTER — Encounter: Payer: Self-pay | Admitting: Vascular Surgery

## 2023-07-07 ENCOUNTER — Ambulatory Visit: Admitting: Vascular Surgery

## 2023-07-07 VITALS — BP 106/74 | HR 71 | Temp 98.7°F | Ht 63.0 in | Wt 231.0 lb

## 2023-07-07 DIAGNOSIS — I872 Venous insufficiency (chronic) (peripheral): Secondary | ICD-10-CM | POA: Diagnosis present

## 2023-07-07 NOTE — Progress Notes (Signed)
 Subjective:     Patient ID: Sherry Wilson, female   DOB: 1980/08/30, 43 y.o.   MRN: [id number]  HPI 43 year old female status post left greater saphenous vein ablation with stab phlebectomy of her medial thigh and knee between 10 and 20 sites.  Her last visit she was noted to have any 1 and has begun taking aspirin.  She has had some pain in her leg but mostly this is resolving she is using vitamin E on her incision sites.  She is back to work full-time at [employer] and is here today for follow-up duplex ultrasound.   Review of Systems As above    Objective:   Physical Exam Vitals:   07/07/23 1502  BP: 106/74  Pulse: 71  Temp: 98.7 F (37.1 C)  SpO2: 95%   Left lower extremity incisions healing well, no edema   RIGHTCompressibilityPhasicitySpontaneityPropertiesThrombus Aging  +-----+---------------+---------+-----------+----------+--------------+  CFV Full           Yes      Yes                                  +-----+---------------+---------+-----------+----------+--------------+         +---------+---------------+---------+-----------+----------+--------------+   LEFT    CompressibilityPhasicitySpontaneityPropertiesThrombus  Aging  +---------+---------------+---------+-----------+----------+--------------+   CFV     Full           Yes      Yes                                   +---------+---------------+---------+-----------+----------+--------------+   SFJ     Full                                                          +---------+---------------+---------+-----------+----------+--------------+   FV Prox  Full           Yes      Yes                                   +---------+---------------+---------+-----------+----------+--------------+   FV Mid   Full           Yes      Yes                                   +---------+---------------+---------+-----------+----------+--------------+    FV DistalFull           Yes      Yes                                   +---------+---------------+---------+-----------+----------+--------------+   PFV     Full                                                          +---------+---------------+---------+-----------+----------+--------------+  POP     Full           Yes      Yes                                   +---------+---------------+---------+-----------+----------+--------------+   PTV     Full                                                          +---------+---------------+---------+-----------+----------+--------------+   PERO    Full                                                          +---------+---------------+---------+-----------+----------+--------------+   Gastroc Full                                                          +---------+---------------+---------+-----------+----------+--------------+   GSV     None                                         s/p ablation     +---------+---------------+---------+-----------+----------+--------------+      Post Ablation Summary:  - No evidence of deep vein thrombosis from the left common femoral through  the popliteal veins.  - Successful ablation of the left Great Saphenous Vein from the proximal  thigh to distal thigh. Findings appear improved from previous examination  with thrombus receeding to 2.05 cm from the SFJ.        Assessment/plan     43 year old female status post left greater saphenous vein ablation saphenectomy complicated by E HIT 1 that is now resolved.  Okay to discontinue aspirin and follow-up as needed.    Sherry Anastasi C. Vikki Graves, MD [facility] [place] Office: [phone] Pager: [phone]

## 2023-07-07 NOTE — Progress Notes (Deleted)
 Patient ID: Sherry Wilson, female   DOB: 1980/02/15, 43 y.o.   MRN: [id number]  Reason for Consult: Follow-up   Referred by Minus Amel, MD  Subjective:     HPI:  Sherry Wilson is a 43 y.o. female ***  History reviewed. No pertinent past medical history. History reviewed. No pertinent family history. Past Surgical History:  Procedure Laterality Date   CESAREAN SECTION     LASER ABLATION Left 04/22/2023   Laser ablation of the left greater saphenous vein with 10-20 stab phlebectomies by Philip Bravo Social History:  Social History   Tobacco Use   Smoking status: Never   Smokeless tobacco: Never  Substance Use Topics   Alcohol use: No    Allergies  Allergen Reactions   Shellfish Allergy Anaphylaxis   Latex    Penicillins Nausea And Vomiting    .Has patient had a PCN reaction causing immediate rash, facial/tongue/throat swelling, SOB or lightheadedness with hypotension: No Has patient had a PCN reaction causing severe rash involving mucus membranes or skin necrosis: No Has patient had a PCN reaction that required hospitalization: No Has patient had a PCN reaction occurring within the last 10 years: No If all of the above answers are NO, then may proceed with Cephalosporin use.     Current Outpatient Medications  Medication Sig Dispense Refill   Multiple Vitamin (MULTIVITAMIN WITH MINERALS) TABS tablet Take 1 tablet by mouth daily.     VITAMIN D, ERGOCALCIFEROL, PO Take by mouth.     No current facility-administered medications for this visit.    REVIEW OF SYSTEMS      Objective:  Objective   Vitals:   07/07/23 1502  BP: 106/74  Pulse: 71  Temp: 98.7 F (37.1 C)  SpO2: 95%  Weight: 231 lb (104.8 kg)  Height: 5' 3 (1.6 m)   Body mass index is 40.92 kg/m.  Physical Exam  Data: LEFT     CompressibilityPhasicitySpontaneityPropertiesThrombus  Aging   +---------+---------------+---------+-----------+----------+--------------+   CFV     Full           Yes      Yes                                   +---------+---------------+---------+-----------+----------+--------------+   SFJ     Full                                                          +---------+---------------+---------+-----------+----------+--------------+   FV Prox  Full           Yes      Yes                                   +---------+---------------+---------+-----------+----------+--------------+   FV Mid   Full           Yes      Yes                                   +---------+---------------+---------+-----------+----------+--------------+   FV DistalFull  Yes      Yes                                   +---------+---------------+---------+-----------+----------+--------------+   PFV     Full                                                          +---------+---------------+---------+-----------+----------+--------------+   POP     Full           Yes      Yes                                   +---------+---------------+---------+-----------+----------+--------------+   PTV     Full                                                          +---------+---------------+---------+-----------+----------+--------------+   PERO    Full                                                          +---------+---------------+---------+-----------+----------+--------------+   Gastroc Full                                                          +---------+---------------+---------+-----------+----------+--------------+   GSV     None                                         s/p ablation     +---------+---------------+---------+-----------+----------+--------------+            Post Ablation Summary:  - No evidence of deep vein thrombosis from the left common femoral through   the popliteal veins.  - Successful ablation of the left Great Saphenous Vein from the proximal  thigh to distal thigh. Findings appear improved from previous examination  with thrombus receeding to 2.05 cm from the SFJ.      Assessment/Plan:     ***     Adine Hoof MD [facility]

## 2023-10-08 ENCOUNTER — Ambulatory Visit: Admitting: Physician Assistant

## 2024-01-05 ENCOUNTER — Other Ambulatory Visit (HOSPITAL_COMMUNITY): Payer: Self-pay | Admitting: Family Medicine

## 2024-01-05 DIAGNOSIS — Z1231 Encounter for screening mammogram for malignant neoplasm of breast: Secondary | ICD-10-CM

## 2024-01-17 ENCOUNTER — Encounter (HOSPITAL_COMMUNITY): Payer: Self-pay

## 2024-01-17 ENCOUNTER — Ambulatory Visit (HOSPITAL_COMMUNITY)
Admission: RE | Admit: 2024-01-17 | Discharge: 2024-01-17 | Disposition: A | Discharge status: Home / Self Care | Source: Ambulatory Visit | Attending: Family Medicine | Admitting: Family Medicine

## 2024-01-17 DIAGNOSIS — Z1231 Encounter for screening mammogram for malignant neoplasm of breast: Secondary | ICD-10-CM | POA: Insufficient documentation
